# Patient Record
Sex: Female | Born: 1980 | Race: White | Hispanic: No | Smoking: Former smoker
Health system: Southern US, Community
[De-identification: ages and names within clinical notes are randomized; demographics above are authoritative.]

## PROBLEM LIST (undated history)

## (undated) DIAGNOSIS — R87629 Unspecified abnormal cytological findings in specimens from vagina: Secondary | ICD-10-CM

## (undated) DIAGNOSIS — R87810 Cervical high risk human papillomavirus (HPV) DNA test positive: Secondary | ICD-10-CM

## (undated) DIAGNOSIS — F17200 Nicotine dependence, unspecified, uncomplicated: Secondary | ICD-10-CM

## (undated) DIAGNOSIS — B009 Herpesviral infection, unspecified: Secondary | ICD-10-CM

## (undated) DIAGNOSIS — E0590 Thyrotoxicosis, unspecified without thyrotoxic crisis or storm: Secondary | ICD-10-CM

## (undated) DIAGNOSIS — IMO0002 Reserved for concepts with insufficient information to code with codable children: Secondary | ICD-10-CM

## (undated) DIAGNOSIS — U071 COVID-19: Secondary | ICD-10-CM

## (undated) DIAGNOSIS — R87619 Unspecified abnormal cytological findings in specimens from cervix uteri: Secondary | ICD-10-CM

## (undated) DIAGNOSIS — K589 Irritable bowel syndrome without diarrhea: Secondary | ICD-10-CM

## (undated) HISTORY — DX: COVID-19: U07.1

## (undated) HISTORY — DX: Nicotine dependence, unspecified, uncomplicated: F17.200

## (undated) HISTORY — DX: Unspecified abnormal cytological findings in specimens from cervix uteri: R87.619

## (undated) HISTORY — DX: Unspecified abnormal cytological findings in specimens from vagina: R87.629

## (undated) HISTORY — DX: Thyrotoxicosis, unspecified without thyrotoxic crisis or storm: E05.90

## (undated) HISTORY — DX: Cervical high risk human papillomavirus (HPV) DNA test positive: R87.810

## (undated) HISTORY — DX: Irritable bowel syndrome, unspecified: K58.9

## (undated) HISTORY — DX: Reserved for concepts with insufficient information to code with codable children: IMO0002

## (undated) HISTORY — DX: Herpesviral infection, unspecified: B00.9

## (undated) HISTORY — PX: WISDOM TOOTH EXTRACTION: SHX21

---

## 2001-05-05 ENCOUNTER — Emergency Department (HOSPITAL_COMMUNITY): Admission: EM | Admit: 2001-05-05 | Discharge: 2001-05-05 | Payer: Self-pay | Admitting: *Deleted

## 2002-01-07 ENCOUNTER — Encounter: Admission: RE | Admit: 2002-01-07 | Discharge: 2002-01-09 | Payer: Self-pay | Admitting: Orthopedic Surgery

## 2002-03-11 ENCOUNTER — Ambulatory Visit (HOSPITAL_COMMUNITY): Admission: RE | Admit: 2002-03-11 | Discharge: 2002-03-11 | Payer: Self-pay | Admitting: *Deleted

## 2002-03-11 ENCOUNTER — Encounter: Payer: Self-pay | Admitting: *Deleted

## 2002-07-30 ENCOUNTER — Ambulatory Visit (HOSPITAL_COMMUNITY): Admission: AD | Admit: 2002-07-30 | Discharge: 2002-07-30 | Payer: Self-pay | Admitting: *Deleted

## 2002-07-31 ENCOUNTER — Inpatient Hospital Stay (HOSPITAL_COMMUNITY): Admission: AD | Admit: 2002-07-31 | Discharge: 2002-08-03 | Payer: Self-pay | Admitting: *Deleted

## 2002-09-19 HISTORY — PX: RECTOVAGINAL FISTULA CLOSURE: SUR265

## 2003-02-14 ENCOUNTER — Observation Stay (HOSPITAL_COMMUNITY): Admission: RE | Admit: 2003-02-14 | Discharge: 2003-02-15 | Payer: Self-pay | Admitting: *Deleted

## 2004-02-09 ENCOUNTER — Other Ambulatory Visit: Admission: RE | Admit: 2004-02-09 | Discharge: 2004-02-09 | Payer: Self-pay | Admitting: *Deleted

## 2005-01-17 ENCOUNTER — Other Ambulatory Visit: Admission: RE | Admit: 2005-01-17 | Discharge: 2005-01-17 | Payer: Self-pay | Admitting: *Deleted

## 2005-08-22 ENCOUNTER — Ambulatory Visit (HOSPITAL_COMMUNITY): Admission: RE | Admit: 2005-08-22 | Discharge: 2005-08-22 | Payer: Self-pay | Admitting: *Deleted

## 2005-11-04 ENCOUNTER — Ambulatory Visit (HOSPITAL_COMMUNITY): Admission: RE | Admit: 2005-11-04 | Discharge: 2005-11-04 | Payer: Self-pay | Admitting: *Deleted

## 2005-11-04 IMAGING — US US OB FOLLOW-UP
1 series · 13 of 28 positions shown · non-contrast
Comparison: [DATE]

CLINICAL DATA: Recheck spine.

 OBSTETRICAL ULTRASOUND RE-EVALUATION:

[Series 1: unknown · 0.32mm/px · 13 of 44 slices shown]
[im 2/44]
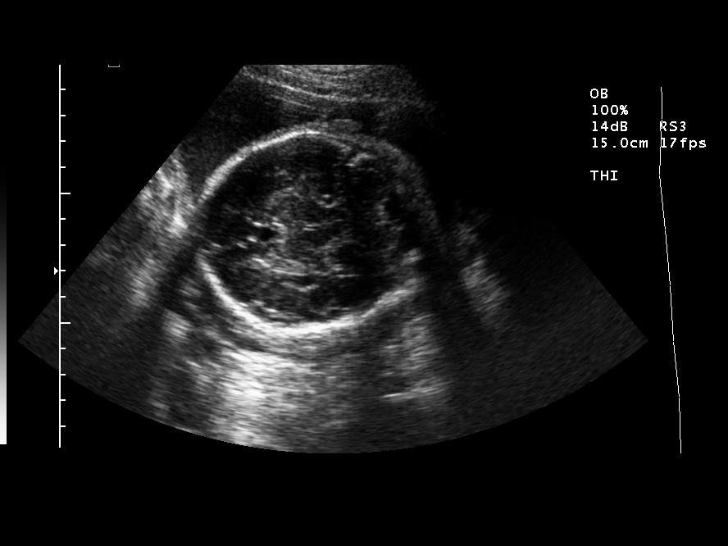
[im 5/44]
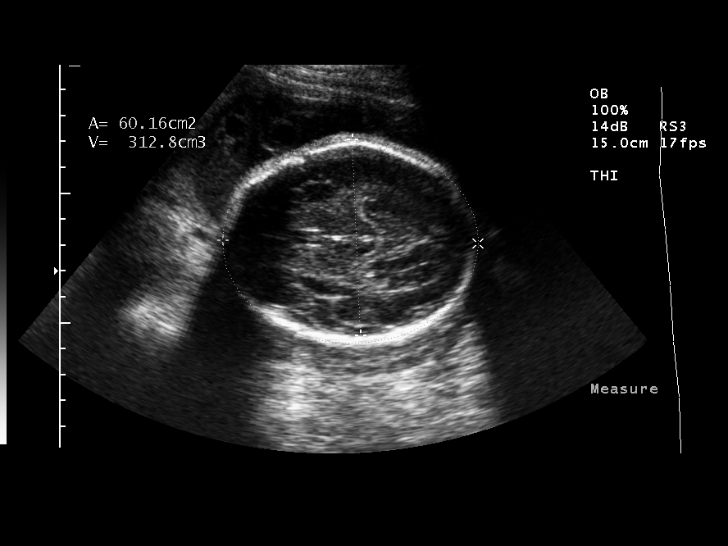
[im 8/44]
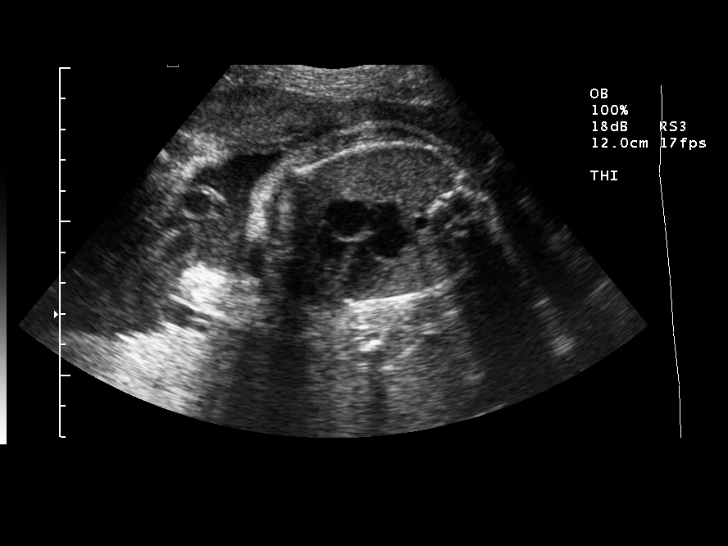
[im 12/44]
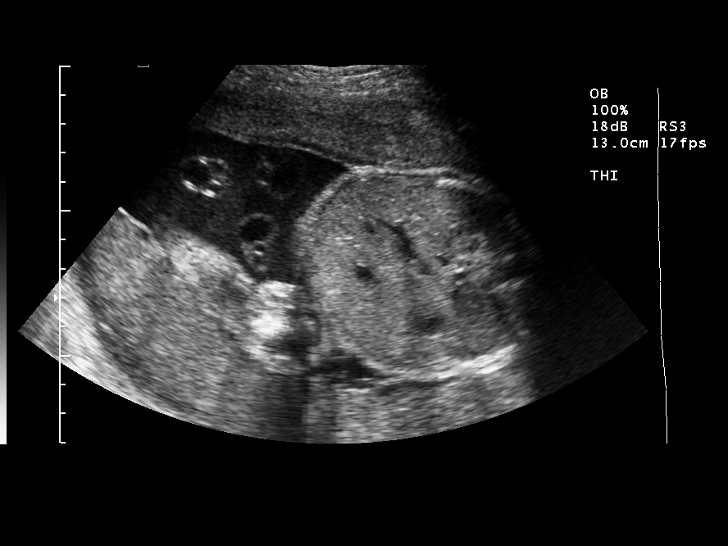
[im 15/44]
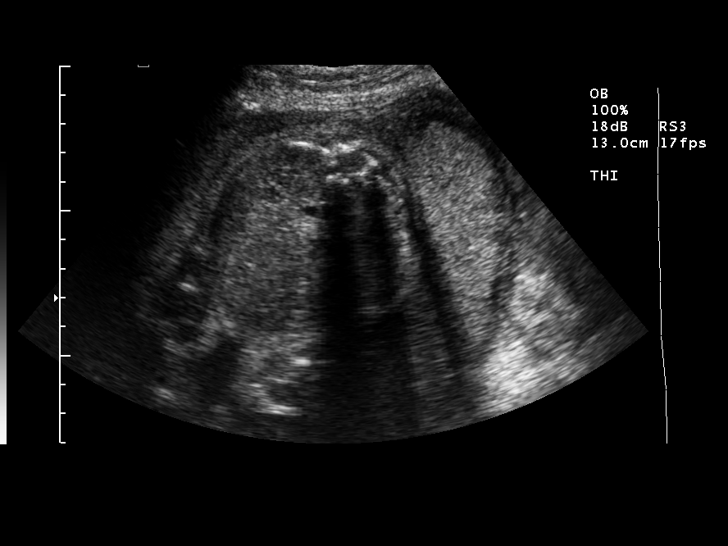
[im 18/44]
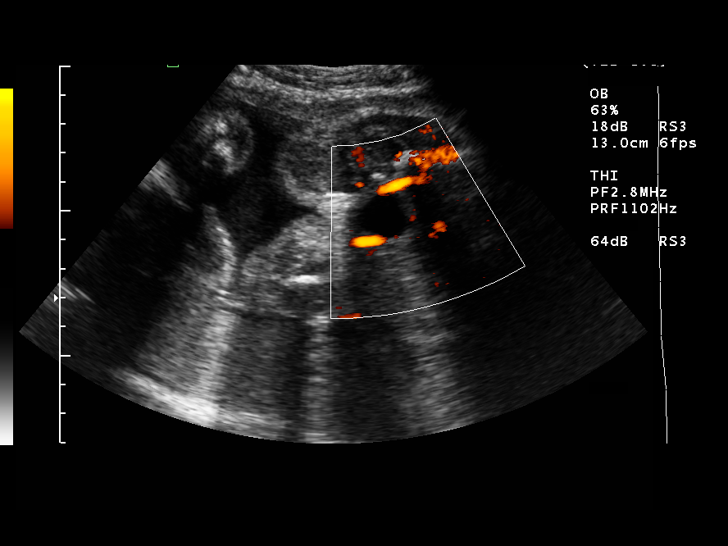
[im 23/44]
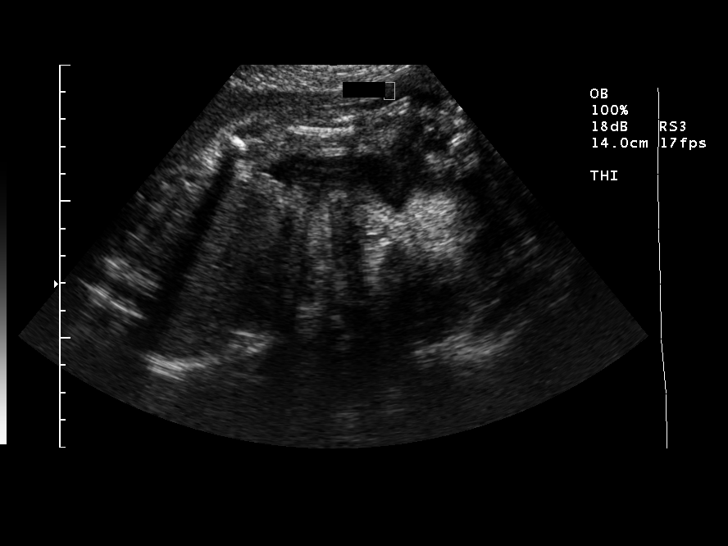
[im 26/44]
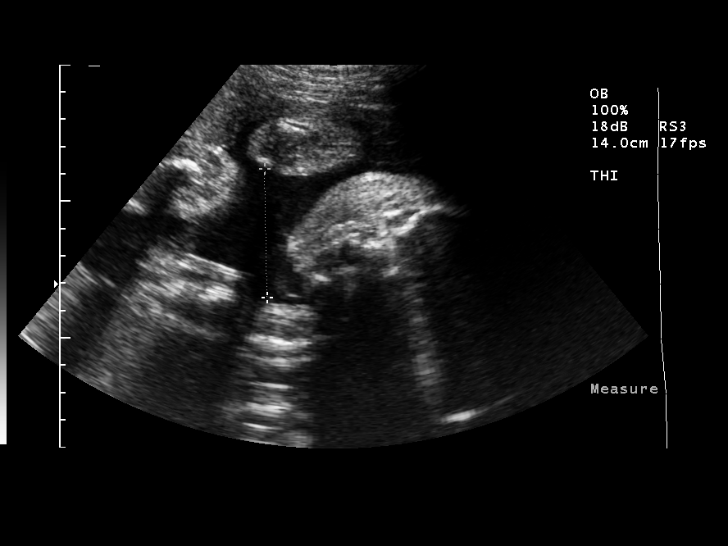
[im 29/44]
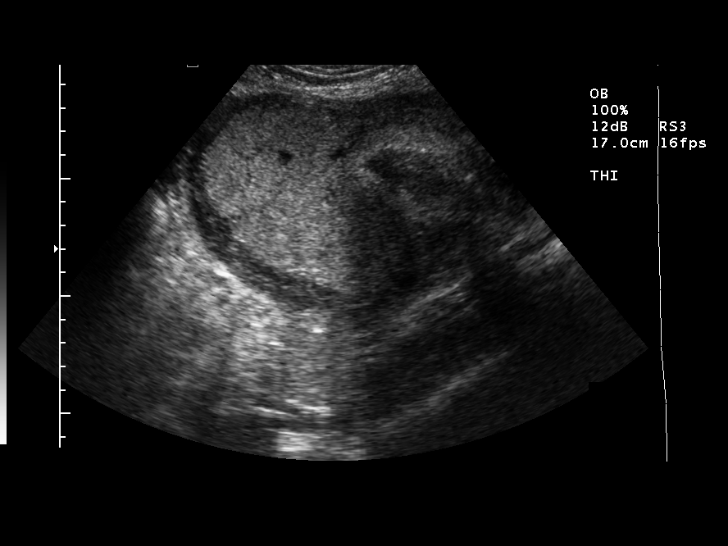
[im 32/44]
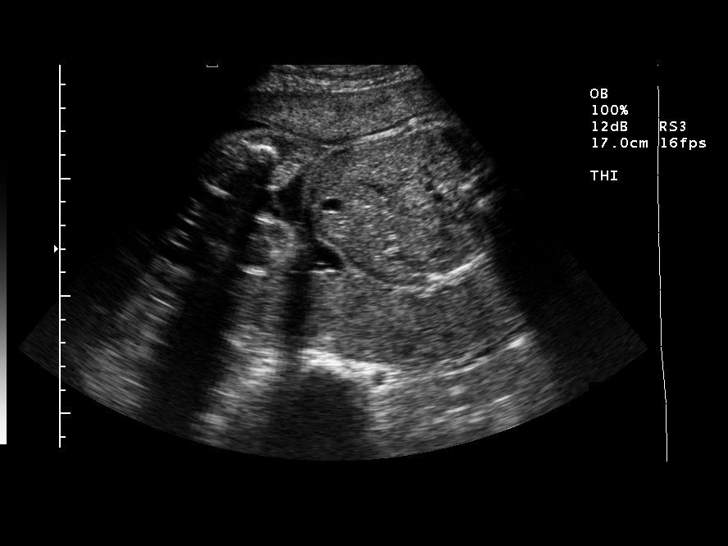
[im 36/44]
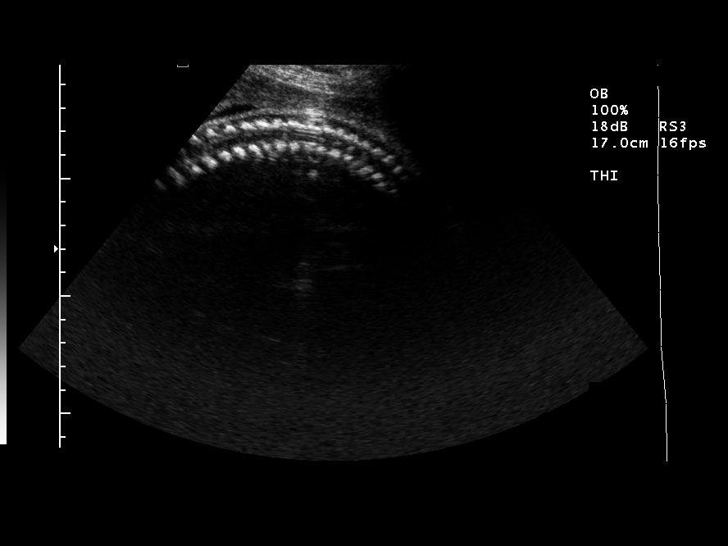
[im 39/44]
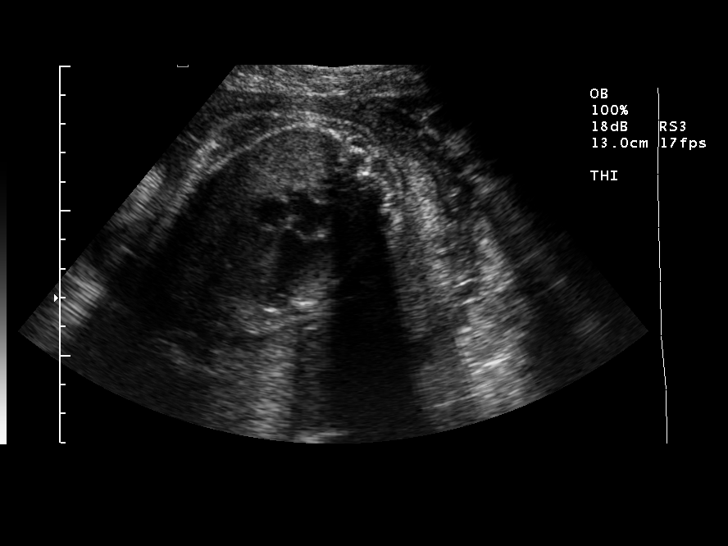
[im 42/44]
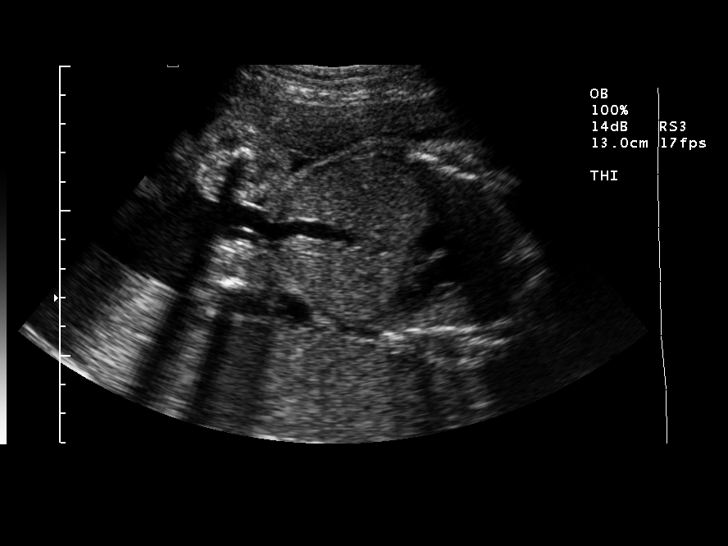

[13 of 28 positions shown; findings below may reference images not displayed]

Number of Fetuses:  1
 Heart Rate:  131 BPM
 Movement:  Yes
 Breathing:  No
 Presentation:  Cephalic
 Placental Location:  Posterior
 Grade:  I
 Previa:  No
 Amniotic Fluid (subjective):  Normal
 Amniotic Fluid (objective):  AFI 11.9 cm ([23] %ile = 9.2 to 23.1 cm for 29  weeks) 

 FETAL BIOMETRY
 BPD:  7.5 cm  30 W 1 D
 HC:    27.7 cm  30 W 3 D
 AC:  24.6 cm  28 W 6 D
 FL:  5.6 cm  29 W 4 D

 Mean GA:  29 W 5 D  US EDC:  [DATE]
 Assigned GA:  28 W 6 D  Assigned GA EDC:  [DATE]

 EFW:  [23]   grams (H) 75th - 90th %ile ([23] – [23] g) for 29 weeks 

 FETAL ANATOMY
 Lateral Ventricles:    Visualized 
 Thalami/CSP:    Visualized 
 Posterior Fossa:    Visualized     
 Nuchal Region:    Previously seen 
 Spine:    Visualized 
 4 Chamber Heart on Left:  Visualized 
 Stomach on Left:  Visualized 
 3 Vessel Cord:  Visualized 
 Cord Insertion Site:  Visualized 
 Kidneys:  Visualized 
 Bladder:  Visualized 
 Extremities:  Previously seen 

 MATERNAL UTERINE AND ADNEXAL FINDINGS
 Cervix:  3.3 cm transabdominally.
IMPRESSION: 1.  Single live intrauterine gestation now in cephalic presentation, breech on previous study.
 2.  No morphologic abnormalities identified, with spine now better visualized and normal in appearance.
 3.  Appropriate interval growth since previous study.
 4.  No acute abnormalities.

## 2006-01-07 ENCOUNTER — Inpatient Hospital Stay (HOSPITAL_COMMUNITY): Admission: AD | Admit: 2006-01-07 | Discharge: 2006-01-10 | Payer: Self-pay | Admitting: *Deleted

## 2007-05-23 ENCOUNTER — Other Ambulatory Visit: Admission: RE | Admit: 2007-05-23 | Discharge: 2007-05-23 | Payer: Self-pay | Admitting: Obstetrics and Gynecology

## 2008-06-27 ENCOUNTER — Other Ambulatory Visit: Admission: RE | Admit: 2008-06-27 | Discharge: 2008-06-27 | Payer: Self-pay | Admitting: Obstetrics and Gynecology

## 2008-09-19 HISTORY — PX: CHOLECYSTECTOMY: SHX55

## 2008-12-22 ENCOUNTER — Encounter (HOSPITAL_COMMUNITY): Admission: RE | Admit: 2008-12-22 | Discharge: 2009-01-21 | Payer: Self-pay | Admitting: Endocrinology

## 2009-01-25 ENCOUNTER — Emergency Department (HOSPITAL_COMMUNITY): Admission: EM | Admit: 2009-01-25 | Discharge: 2009-01-25 | Payer: Self-pay | Admitting: Emergency Medicine

## 2009-12-28 DIAGNOSIS — E0590 Thyrotoxicosis, unspecified without thyrotoxic crisis or storm: Secondary | ICD-10-CM

## 2009-12-28 HISTORY — DX: Thyrotoxicosis, unspecified without thyrotoxic crisis or storm: E05.90

## 2010-03-09 ENCOUNTER — Encounter (HOSPITAL_COMMUNITY)
Admission: RE | Admit: 2010-03-09 | Discharge: 2010-03-10 | Payer: Self-pay | Source: Home / Self Care | Admitting: Endocrinology

## 2010-05-28 ENCOUNTER — Other Ambulatory Visit: Admission: RE | Admit: 2010-05-28 | Discharge: 2010-05-28 | Payer: Self-pay | Admitting: Obstetrics and Gynecology

## 2010-10-10 ENCOUNTER — Encounter: Payer: Self-pay | Admitting: Endocrinology

## 2010-12-28 LAB — URINALYSIS, ROUTINE W REFLEX MICROSCOPIC
Glucose, UA: NEGATIVE mg/dL
Protein, ur: 30 mg/dL — AB
pH: 6.5 (ref 5.0–8.0)

## 2010-12-28 LAB — URINE MICROSCOPIC-ADD ON

## 2011-02-04 NOTE — Op Note (Signed)
Nicole Bell, Nicole Bell                 ACCOUNT NO.:  [id number]   MEDICAL RECORD NO.:  [id number]          PATIENT TYPE:  INP   LOCATION:  A401                          FACILITY:  [facility]   PHYSICIAN:  Langley Gauss, MD     DATE OF BIRTH:  [birth year]   DATE OF PROCEDURE:  DATE OF DISCHARGE:                                 OPERATIVE REPORT   DATE OF DELIVERY:  January 08, 2006.   DELIVERY PERFORMED:  1.  Spontaneous assisted vaginal delivery to a 7 pound, 11 ounce female      infant.  2.  Repair of first-degree perineal laceration.  3.  Placement of continuous lumbar epidural anesthesia.   DELIVERY PERFORMED:  Dr. Langley Gauss.   ESTIMATED BLOOD LOSS:  Less than 500 mL.   COMPLICATIONS:  None.   SPECIMENS:  Arterial cord gas and cord blood to the laboratory. The placenta  is examined and noted to be apparently intact with a three-vessel umbilical  cord. Total estimated blood loss of 500 cc.   SUMMARY:  The patient was admitted for induction of labor.  Initial  examination:  3 cm dilated, 80% effaced, 0 station, vertex is well applied  to the cervix. A fetal scalp electrode was placed with resultant amniotomy  at that time with findings of clear amniotic fluid. The patient had a  reassuring fetal heart rate. She initially requested only IV narcotics for  pain relief, but with the onset of active labor and inadequate response to  the narcotics, she requested an epidural.  An epidural was thus placed in  the p.m. of January 07, 2006, for relief of labor pain.  Initially, the  epidural, in the beginning, was a suboptimal block.  The patient had been  subsequently examined by the nursing staff and noted to be 9 cm dilatation.  At that time, she was given a bolus of 5 cc of 2% lidocaine plain, which  subsequently did result in an excellent bilateral block, which provided  adequate relief for the remainder of the labor course. Subsequently, the  patient was continued on the Pitocin.  She  progressed to  complete  dilatation, at which time she began having the urge to push. The patient  pushed well during the short second stage of labor.  She was placed in the  dorsolithotomy position, and prepped and draped in the usual sterile manner.  The perineum was milked for thinning of the perineal skin during second  stage.  A total of 20 cc of 1% lidocaine plain was injected.  The infant was  noted to descend very well in the birth canal.  The head was well flexed and  noted to be in an __________ position.  Excellent perineal support was  provided at the time of delivery. No episiotomy was required.  The infant  was delivered in a direct away position.  Mouth and nares were bulb  suctioned of clear amniotic fluid. Renewed expulsive efforts resulted in  spontaneous rotation to a left anterior shoulder position. Gentle downward  traction and combined expulsive efforts resulted in very  easy delivery of  this anterior shoulder beneath the pubic symphysis, without difficulty. The  remainder of the infant was also delivered without difficulty.  The  umbilical cord was then __________ towards the infant.  The cord was doubly  clamped and cut and the infant was taken to the nursery table for immediate  assessment. Arterial cord gas and cord blood was then obtained.  Gentle  traction of the umbilical cord resulted in separation, which upon  examination, appeared to be an intact placenta with associated three-vessel  umbilical cord.  Excellent uterine tone was achieved immediately following  delivery. Examination of the genital tract reveals only a first-degree  perineal laceration.  It was easily repaired, utilizing two figure-of-eight  sutures of 0 chromic to restore the normal anatomy. There is no evidence of  any lacerations beyond this.  The patient subsequently was taken out of the  dorsolithotomy position and rolled to her side, at which time the epidural  catheter was removed with  the blue tip noted to be intact. The patient was  advised not to ambulate x2 hours' time. Both mother and infant are doing  well following delivery.      Langley Gauss, MD  Electronically Signed     DC/MEDQ  D:  01/08/2006  T:  01/09/2006  Job:  [id number]

## 2011-02-04 NOTE — Op Note (Signed)
NAME:  Nicole Bell, Nicole Bell                           ACCOUNT NO.:  [id number]   MEDICAL RECORD NO.:  [id number]                   PATIENT TYPE:  INP   LOCATION:  A425                                 FACILITY:  [facility]   PHYSICIAN:  Langley Gauss, M.D.                DATE OF BIRTH:  14-Jan-1981   DATE OF PROCEDURE:  07/31/2002  DATE OF DISCHARGE:  08/03/2002                                 OPERATIVE REPORT   DATE OF DELIVERY:  July 31, 2002.   DIAGNOSES:  1. A 38 plus weeks' intrauterine pregnancy for induction of labor.  2. Gestational hypertension.  No history of hypertension preceding the     pregnancy.   PROCEDURES:  1. Placement of a Foley bulb catheter for mechanical ripening of the cervix.     Epidural analgesia.  The delivery form is a low vacuum extraction 7 pound     4 ounce female infant delivered by Langley Gauss, M.D.  2. Repair of a midline episiotomy with a fourth-degree perineal extension.     Analgesia is continuous lumbar epidural analgesia.  This is supplemented     with a total of 50 cc of 1% lidocaine plain. The findings at time of     delivery include the infant 7 pound 4 ounce female, found to be in an OP     position.   SUMMARY:  July 31, 2002, the patient admitted for induction of labor.  She was noted to have mildly elevated blood pressures during the last week  of her pregnancy.  Due to the unfavorable nature of the cervix, a Foley bulb  catheter was placed on July 31, 2002, for mechanical ripening of the  cervix.  After removal of this, the patient was noted to be 2 cm dilated.  At that time, fetal scalp electrode is placed with resultant amniotomy; a  very strong heart rate is noted.  The patient thereafter was induced with  Pitocin to achieve function of her pattern.  The patient initially was  treated with IV narcotics for pain relief; however, due to inadequate  analgesia provided, this patient requested epidural analgesic.  The epidural  functioned well throughout the remainder of the labor course.  The patient  thereafter reached complete dilatation.  At complete dilatation, the patient  was placed in the dorsal lithotomy position and encouraged to push.  During  this second stage of labor, however, there were prolonged decelerations of  the fetal heart rate to 60-90 beats per minute.  Thus with the vertex at a  +2 station, the decision was made to shorten second stage of labor and  assist with delivery.  Kiwi vacuum extractor was initially utilized.  At all  times pressure was kept within the safety range.  On the successive next  three contractions, gentle traction was provided with the Kiwi vacuum  extractor while the patient was encouraged to maximize  her expulsive  efforts.  There was descent of the vertex to the perineal floor at which  time the Kiwi vacuum extractor was noted to pop off.  There was noted to be  beginning of molding of the fetal vertex, thus the Mityvac vacuum was  placed.  This was then connected to the hand-held pump.  At all times,  pressure was kept within the safe_______ range.  On the successive next two  contractions with gentle traction and expulsive efforts, the infant was  noted to deliver in the OP position.  With distension of the perineum, a  midline episiotomy had been performed.  Despite excellent perineal support  provided at time of delivery, the midline episiotomy was noted to extend to  a fourth-degree perineal laceration.  Following delivery of the vertex, the  mouth and nares were bulb-suctioned of clear amniotic fluid.  We noted  expulsive efforts resulted in a spontaneous rotation to a left anterior  shoulder position.  Gentle down-retraction combined with expulsive efforts  resulted in delivery the shoulder, on Kiwi assist, symphysis as well as  remainder of the infant.  The umbilical cord is then milked towards the  infant and infant is placed on the warming table for  immediate assessment.  Arterial cord gas and cord blood are then obtained.  Gentle traction of the  umbilical cord results in separation which upon examination appears to be an  intact three vessel placenta and cord.  The perineal area is then examined.  The midline episiotomy is noted to extend to a fourth-degree perineal  laceration.  The rectal mucosa being involved the length of about 3 cm.  No  stool or any particulate matter is identified.  The area is sterilely  prepped utilizing the Calgon solution.  The edges of the rectal mucosa are  identified.  The rectal mucosa is closed with a continuous running 3-0  Monocryl suture in a through-and-through fashion.  Vicryl sutures of 2-0  then utilized to reinforce the pararectal fascia overlying this suture  length.  The rectal sphincter is then identified.  The edges are grasped  with Allis clamps.  The rectal sphincter is reapproximated in the midline  utilizing interrupted 2-0 Vicryl sutures placed anterior to posterior,  superiorly and inferiorly to restore the normal anatomy.  Following this,  some additional 2-0 Vicryl sutures are placed to the superficial transverse  perineal muscle for additional support.  The vaginal mucosa is then closed  with a continuous running 0 chromic suture in a running lock fashion  followed by a layer of 0 chromic suture on the perineal body.  Following  delivery, the vagina appears to be intact.  However, rectal examination is  performed which reveals a buttonhole defect at the most superior aspect of  the rectal repair.  The defect, about 0.5 cm in size with complete  nonapproximation of the rectal mucosa.  At this point in time, in an effort  to promote healing and prevent any postoperative fistula formation, the  entire procedure is reperformed.  In the midline, the suture scissors are  used to cut through each of our suture layers to result in again the opened midline episiotomy with a fourth-degree  perineal extension.  The tissue is  again carefully examined and identified, again sterile prepped utilizing the  Calgon solution.  Re-repair is then required.  A Gelpi retractor is called  for which does assist with visualization.  This then allows me to better  visualize the rectal  mucosa which is again then reapproximated utilizing a 3-  0 Monocryl suture in a through-and-through fashion.  Again 2-0 Vicryl suture  is placed to reinforce the perirectal fascia.  The rectal sphincter is  likewise reapproximated in the midline utilizing interrupted 2-0 Vicryl  suture.  Superficial transverse perineal muscle reinforced with 2-0 Vicryl  suture and the vaginal mucosa closed with a 0 chromic in a running lock  fashion followed by 0 chromic in the perineal body.  The patient tolerated  the delivery very well.  She did require reinjection of 20 cc of 1%  lidocaine plain, thus bringing the total to 50 cc of 1% lidocaine plain  utilized for the repair.  In addition, the patient was treated with 10 mg of  IV Nubain during the repair.  Examination at this time following repair  reveals restoration of the vaginal mucosa.  Rectal examination is performed.  The rectal sphincter is intact.  Likewise, the entirety of the rectal mucosa  is intact with no evidence of any anatomic defect.  Thus the procedure is  terminated.  The patient is cleansed.  She is taken out of dorsal lithotomy  position and rolled to her side at which time the epidural catheter is  removed with the blue tip noted to be intact.  Both mother and infant doing  well following delivery.  The patient is informed of the nature of the  episiotomy and subsequent repair.  She will be treated during the postpartum  period with stool softeners, Colace 100 mg p.o. q.i.d.  In addition, 3 g of  IV Unasyn was administered during the repair.                                               Langley Gauss, M.D.    DC/MEDQ  D:  08/05/2002  T:   08/05/2002  Job:  [id number]

## 2011-02-04 NOTE — Op Note (Signed)
NAMEDORINA, RIBAUDO NO.:  [id number]   MEDICAL RECORD NO.:  [id number]                  PATIENT TYPE:   LOCATION:                                       FACILITY:   PHYSICIAN:  Langley Gauss, M.D.                DATE OF BIRTH:   DATE OF PROCEDURE:  02/14/2003  DATE OF DISCHARGE:                                 OPERATIVE REPORT   PREOPERATIVE DIAGNOSIS:  Rectovaginal fistula.   POSTOPERATIVE DIAGNOSIS:  Rectovaginal fistula.   PROCEDURE:  Repair of rectovaginal fistula and reapproximation of internal  and external rectal sphincter.   SURGEON:  Roylene Reason. Lisette Grinder, M.D.   ESTIMATED BLOOD LOSS:  Less than 100 cc.   ANALGESIA:  General endotracheal in addition 30 cc of 0.5% bupivacaine plain  is injected along the suture line at the completion of the procedure.   FINDINGS AT THE TIME OF SURGERY:  A rectovaginal fistula about the diameter  of the tip of my index fingers which was 2 cm up from the hymenal ring.  In  addition the tone at the rectal sphincter was known to be attenuated.   SUMMARY:  The patient was taken to the operating room, vital signs were  stable.  The patient underwent uncomplicated induction of general  endotracheal anesthesia at which time she was placed in the lithotomy  position utilizing the candy cane stirrups.  Examination at this time  revealed a large amount of semi-solid, brown, nonmalodorous stool present  within the rectum.  This was present despite the fact that the patient had  been advised to take over-the-counter laxatives x1 week duration.  Thus a  mechanical bowel prep is performed at this time.  I digitally manipulated  the stool vaginal and milked it out the rectum.  Betadine prep was then  performed.  After some period of time additional manipulation vaginal  resulted in additional stool being obtained.  This was continued as high as  I could reach until no additional stool was obtained.   The vaginal as  well as the rectal area are then prepped with Betadine  solution, no stool is encountered.  A vaginal packing is then placed within  the rectum above our area of repair to prevent any stooling during the  operative procedure.  Examination revealed the persistent rectovaginal  fistula as described previously.  To repair this I placed Allis clamp at the  posterior vaginal vestibule.  With these on traction a sharp knife was used  to incise between these two.  This then allowed me to dissect in the  avascular plane between the vaginal mucosa and the underlying rectovaginal  fascial layer.  This dissection was continued up to the area of the  rectovaginal fistula and slightly beyond.  This allows easy visualization of  the rectal mucosa which then allows the edges to be easily reapproximated  with a continuous  running 2-0 chromic suture being careful to begin the  suture line above the apex of the defect noted.  The continuous running  suture is continued until the rectal mucosa is entirely reapproximated.   The rectal sphincter itself is noted to be also deficient in tone, thus  dissection is performed at 12 o'clock at the level of the internal and  external rectal sphincter dissecting on each side down to the level where  the rectal sphincter muscles are encountered.  These dissected free are then  grasped with Allis clamps and reapproximated in the midline utilizing 2-0  Vicryl sutures placing these anterior, posterior, superior and inferiorly to  restore the normal muscular anatomy.  These are then brought together in the  midline which does increase the muscular tension of the anal opening.  I  then continued with re-enforcement overlying the rectal mucosa.  The  perirectal fascia is identified and this is oversewn in the midline  overlying our rectal mucosa repair utilizing 2-0 Vicryl suture in an  interrupted fashion.  This completely covers our suture line and is  continued to the  outermost portion of the rectal mucosa.  In addition, the  levator ani muscle as well as the superficial transverse perineal muscle is  reapproximated in the midline utilizing a 2-0 Vicryl interrupted suture.   A third suture line is then placed utilizing #0 chromic in a running lock  fashion to reapproximate the vaginal mucosa.  This is begun at the apex of  the incision prior to this, redundant vaginal mucosa is sharply excised.  A  transition suture is placed at the level of the hymenal ring.  The skin the  perineal body is then completely reapproximated utilizing this continuous  running #0 chromic suture to restore the normal anatomy.  Following the  procedure, a rectal examination is performed which reveals an intact  rectovaginal septum.  No areas of weakness or defect are identified. The  packing within the rectum is then removed.  No stool is identified.  An  additional clamping of the vagina followed by the rectal area is then  performed utilizing Betadine.   The patient tolerated the procedure well.  To facilitate postoperative  analgesia a total of 30 cc of 0.5% bupivacaine plain is injected along our  suture line.  The patient is then reversed of anesthesia and taken to the  recovery room in stable condition at which time the operative findings were  discussed with the patient's awaiting family.  The patient, at this point in  time, does have a Foley catheter placed to straight drainage, draining clear  yellow urine.                                               Langley Gauss, M.D.    DC/MEDQ  D:  02/14/2003  T:  02/15/2003  Job:  [id number]

## 2011-02-04 NOTE — Op Note (Signed)
   NAME:  Nicole Bell, Nicole Bell                           ACCOUNT NO.:  [id number]   MEDICAL RECORD NO.:  [id number]                   PATIENT TYPE:  INP   LOCATION:  A425                                 FACILITY:  [facility]   PHYSICIAN:  Langley Gauss, M.D.                DATE OF BIRTH:  1980-11-01   DATE OF PROCEDURE:  07/31/2002  DATE OF DISCHARGE:  08/03/2002                                 OPERATIVE REPORT   PROCEDURE:  Placement of Foley bulb for mechanical ripening of cervix prior  to amniotomy, procedure performed by Langley Gauss, M.D.   DESCRIPTION OF PROCEDURE:  Appropriate informed consent was obtained.  External fetal monitoring had revealed a reassuring fetal heart rate with a  reactive nonstress test and no fetal heart rate decelerations noted.  There  were noted to be some irregular uterine contractions, very mild in  intensity.  The patient is placed in the dorsal lithotomy position.  The  cervix is visualized and noted to be about 1 cm dilated by previous  examination.  The 16 French Foley catheter with the soft tip is then  inserted through the endocervical os into the lower uterine segment and  inflated to a volume of 50 cc of sterile normal saline.  Gentle traction  confirms proper placement.  The procedure is then terminated.  Fetal heart  rate is again noted to be reassuring with a continued reactive nonstress  test following the procedure.  The patient is returned to the left lateral  supine position.                                               Langley Gauss, M.D.    DC/MEDQ  D:  08/05/2002  T:  08/05/2002  Job:  [id number]

## 2011-02-04 NOTE — Op Note (Signed)
   NAME:  Nicole Bell, Nicole Bell                           ACCOUNT NO.:  [id number]   MEDICAL RECORD NO.:  [id number]                   PATIENT TYPE:  INP   LOCATION:  A425                                 FACILITY:  [facility]   PHYSICIAN:  Langley Gauss, M.D.                DATE OF BIRTH:  03/22/1981   DATE OF PROCEDURE:  07/31/2002  DATE OF DISCHARGE:  08/03/2002                                 OPERATIVE REPORT   PROCEDURE:  Placement of continuous lumbar epidural analgesia at the L3-4  interspace, performed by Langley Gauss, M.D.   COMPLICATIONS:  None.   DESCRIPTION OF PROCEDURE:  Appropriate informed consent was obtained.  The  patient was placed in a seated position.  The bony landmarks were  identified.  The L3-4 interspace was chosen.  The patient's back is  sterilely prepped and draped in the usual manner.  Five cubic centimeters of  1% lidocaine plain is injected to raise a small skin wheal.  A 17-gauge  Tuohy-Schliff needle is then utilized with loss of resistance to an air-  filled glass syringe to identify entry into the epidural space on the first  attempt without difficulty.  An initial test dose of 5 cc 1.5% lidocaine  plus epinephrine injected through the epidural needle.  No signs of CSF or  intravascular injection obtained.  Thus the epidural catheter was inserted  to a depth of 5 cm.  The epidural needle is removed.  Aspiration test is  negative.  A second dose of 2 cc 1.5% lidocaine plus epinephrine injected  through the epidural catheter.  Again no signs of CSF or intravascular  injection obtained.  Thus the patient was connected to an infusion pump  containing the standard mixture.  She will be treated with a bolus of 10 cc,  followed by a continuous infusion rate of 14 cc/hr.  Upon completion of the  procedure, the patient is returned to the left lateral supine position, had  evidence of a well-setting up bilateral epidural block.           Langley Gauss, M.D.    DC/MEDQ  D:  08/05/2002  T:  08/05/2002  Job:  [id number]

## 2011-02-04 NOTE — Discharge Summary (Signed)
NAME:  Nicole Bell, HAGGARD                           ACCOUNT NO.:  [id number]   MEDICAL RECORD NO.:  [id number]                   PATIENT TYPE:  INP   LOCATION:  A425                                 FACILITY:  [facility]   PHYSICIAN:  Langley Gauss, M.D.                DATE OF BIRTH:  08-12-1981   DATE OF ADMISSION:  07/31/2002  DATE OF DISCHARGE:  08/03/2002                                 DISCHARGE SUMMARY   DIAGNOSES:  1. Thirty-eight-plus-week intrauterine pregnancy.  2. Gestational hypertension.   PROCEDURES:  1. Placement of Foley bulb.  2. Placement of epidural.  3. Low vacuum extraction, 7-pound 4-ounce female infant in OP position.  4. Repair of midline episiotomy with a fourth-degree perineal extension.   FOLLOW-UP:  The patient will follow up in the office in three days' time for  examination of the integrity of the pelvic repair.   DISCHARGE MEDICATIONS:  1. Tylox #30 for pain relief.  2. Colace 100 mg p.o. q.i.d. x 10 days.  3. Peri-Colace 1 p.o. q.d. x 10.   The patient is breast-feeding at the time of discharge.  She is utilizing  pediatrician on call, Dr. Vivia Ewing.   LABORATORY DATA:  A positive blood type.  Admission hemoglobin and  hematocrit 11.5/32.7 with a white count of 8.3.  Postpartum day #1  hemoglobin 9.5, hematocrit 27.2.   HOSPITAL COURSE:  The patient was admitted on July 31, 2002.  Foley bulb  initially placed for mechanical ripening of the cervix.  Upon removal of the  Foley bulb catheter she was noted to be 2 cm dilated.  Amniotomy was  performed with the findings of clear amniotic fluid.  Fetal scalp electrode  was placed.  The patient did receive Pitocin induction at this time with  discomfort associated with uterine contractions.  The patient requested  epidural analgesic.  Epidural functioned very well throughout the remainder  of the labor course.  The patient progressed normally along the labor curve  to complete dilatation.  She did  have the prolonged deceleration during the  second stage of labor as described previously.  To shorten the second stage  of labor, a low vacuum extraction was performed, complicated by findings of  an OP position.  A midline episiotomy and fourth-degree perineal extension  were repaired.  The patient received 3 g of IV Unasyn during repair.  Postpartum the patient did very well.  She did complain of significant  perineal discomfort which responded well to p.o. Tylox.  In addition, the  patient was noted to have significant soft-tissue edema which did improve  during the postpartum period.  The patient was, however, able to ambulate  and void without difficulty.  She had no malodorous discharge.  The patient  did not have a bowel movement during this hospital stay but was treated  aggressively with stool softeners.  The patient did well to  the time of  discharge on August 03, 2002.  At that time the perineal repair was again  examined.  It appears to be intact, with no malodorous discharge.  No stool  present.  Thus, patient discharged home on August 03, 2002, as stated  previously, to follow up for reevaluation of the integrity of the repair.  Discussion is held at the time of discharge with the patient regarding the  midline episiotomy with the extension to a fourth-degree perineal laceration  primarily due to the OP position of the infant at the time of delivery.  The  patient expresses understanding and will take stool softeners as previously  described.                                               Langley Gauss, M.D.    DC/MEDQ  D:  08/05/2002  T:  08/05/2002  Job:  [id number]

## 2011-02-04 NOTE — Discharge Summary (Signed)
NAMESHELBI, Nicole Bell                 ACCOUNT NO.:  [id number]   MEDICAL RECORD NO.:  [id number]          PATIENT TYPE:  INP   LOCATION:  A401                          FACILITY:  [facility]   PHYSICIAN:  Langley Gauss, MD     DATE OF BIRTH:  Feb 08, 1981   DATE OF ADMISSION:  01/07/2006  DATE OF DISCHARGE:  04/24/2007LH                                 DISCHARGE SUMMARY   DATE OF PLACEMENT OF THE EPIDURAL:  January 07, 2006.   DATE OF VAGINAL DELIVERY:  January 08, 2006.   Patient is given a copy of standardized discharge instructions at time of  discharge.  She does desire an IUD for postpartum birth control purposes.  She is advised this can be placed six weeks or more postpartum.   DISCHARGE MEDICATIONS:  Tylox #20 without refill.   PERTINENT LABORATORY STUDIES:  GBS carrier status found to be positive.  Patient was treated during the course of labor with IV ampicillin in an  effort to prevent vertical transmission.  The infant also was observed  greater than x48 hours postpartum for any evidence of early onset group B  sepsis.  Both mother and infant doing well on postpartum day greater than 48  hours and discharged to home.  The patient is aware that she will have to  seek another care [name] for ongoing continued postpartum care.   HOSPITAL COURSE:  Patient admitted a.m. of January 07, 2006.  Separate  dictation is performed.  Onset of active labor.  Patient request epidural.  This was placed in p.m. of January 07, 2006 without difficulty.  This  functioned very well throughout the remainder of labor course.  Patient  progressed well through an uncomplicated vaginal delivery on January 08, 2006.  Postpartum patient did well.  Fully ambulatory.  Had only minimal discomfort  associated with repair of the first-degree perineal laceration.  No evidence  of any breakdown or trauma to the perirectal area.  Thus, patient discharged  home on January 10, 2006.      Langley Gauss, MD  Electronically Signed     DC/MEDQ  D:  01/15/2006  T:  01/16/2006  Job:  [id number]

## 2011-02-04 NOTE — Op Note (Signed)
Nicole Bell, Nicole Bell                 ACCOUNT NO.:  [id number]   MEDICAL RECORD NO.:  [id number]          PATIENT TYPE:  INP   LOCATION:  A401                          FACILITY:  [facility]   PHYSICIAN:  Langley Gauss, MD     DATE OF BIRTH:  [birth year]   DATE OF PROCEDURE:  01/07/2006  DATE OF DISCHARGE:                                 OPERATIVE REPORT   PROCEDURE PERFORMED:  Placement of continuous lumbar epidural analgesia at  the L3-L4 interspace; performed by Dr. Langley Gauss.   COMPLICATIONS:  None   SUMMARY:  Appropriate informed consent was obtained. The patient had  epidural placed with previous labor and delivery.  She again requested  placement of an epidural for relief of labor pain at this time.  She was  placed in a seated position. Bony landmarks were identified. The L3-L4  interspace was selected; 5 cc 1% lidocaine plain injected at the midline in  the L3-L4 interspace to raise a small skin wheal. A 17 gauge Tuohy Schliff  needle was then utilized with loss of resistance and air-filled glass  syringe to atraumatically identify entry into the epidural space on the  first attempt without difficulty. Excellent loss of resistance is noted.  Initial test dose was 5 cc 1.5% lidocaine plus epinephrine injected through  the epidural needle. No signs of CSF or intravascular injection obtained.  Epidural catheter was then inserted to a depth of 5 cm.  The epidural needle  was removed. Aspiration test, however, does reveal the presence of blood.  The catheter was then withdrawn 1 cm; again aspiration revealed blood.  It  then withdrawn an additional 2 cm, which should leave [place] 2 cm within the  epidural space.  On this attempt no blood was obtained with aspiration.  The  second test dose of 2 cc 1.5% lidocaine plus epinephrine injected through  the catheter. No signs of CSF or intravascular injection obtained. The  patient is having warmth in the legs, consistent with setting up  epidural  block. The catheter was secured in place. The patient was connected to the  infusion pump, containing the standard mixture which will be infused at a  rate of 14 cc/hour.      Langley Gauss, MD  Electronically Signed     DC/MEDQ  D:  01/08/2006  T:  01/09/2006  Job:  [id number]

## 2011-02-04 NOTE — H&P (Signed)
Nicole Bell, Nicole Bell                 ACCOUNT NO.:  [id number]   MEDICAL RECORD NO.:  [id number]          PATIENT TYPE:  INP   LOCATION:  A401                          FACILITY:  [facility]   PHYSICIAN:  Langley Gauss, MD     DATE OF BIRTH:  12/28/1980   DATE OF ADMISSION:  01/07/2006  DATE OF DISCHARGE:  LH                                HISTORY & PHYSICAL   HISTORY OF THE PRESENT ILLNESS:  This is a 30 year old gravida 2, para 1 at  62-[redacted] weeks gestation who is admitted for induction of labor.  Apparently  induction had been planned for this date previously, but due to a high  census it had been cancelled enabling [place] to utilize resources at that time,  and induction was rescheduled for January 07, 2006.  The patient's prenatal  course has been complicated GBS __________  status positive.  She will be  treated with antibiotics __________  labor.  She was noted to not have a  normal AFP triplet screen.  Serial ultrasounds have adequate fetal growth.  Apparently on her most recent she did have a limited ultrasound performed,  which showed the baby to be back up consistent with occiput anterior  position of the fetal vertex.   PAST OBSTETRICAL HISTORY:  The patient's past OB history is pertinent for a  prior vaginal delivery utilizing an epidural, but requiring suctioning.  The  infant at that time was noted to be delivered in the occiput posterior  position resulting in a fourth degree laceration, which was repaired.  The  patient subsequently had breakdown of that fourth degree repair, which  became clinically evident about six months postpartum and it was re-repaired  at that time with excellent healing and no residual functional or anatomical  defect.  The patient, however, was offered primary C section or this labor  and delivery to avoid injury to the perineal or rectal area again.  Overall,  the patient prefers a trial of labor; and, with the cervix noted to be very  favorable, the vertex  engaged and in an occiput anterior position the  prognosis for vaginal delivery was considered to be good; and, all efforts  will be taken to protect the integrity of the perineum.   PAST MEDICAL HISTORY:  The patient is also noted to have a history of a LEEP  procedure for severe dysplasia. She has subsequently had normal Pap smears  since that procedure was performed.   ALLERGIES:  The patient has no known drug allergies.   MEDICATIONS:  Current medications include prenatal vitamins.   PHYSICAL EXAMINATION:  GENERAL APPEARANCE:  This is a pleasant white female.  VITAL SIGNS:  Weight 135 pounds, height 5 feet 2 inches, blood pressure is  125/70, pulse rate 80, respiratory rate 20.  HEENT:  The head, eyes, ears, nose and throat are negative.  NECK:  No adenopathy.  The neck is supple.  Thyroid is not palpable.  LUNGS:  The lungs are clear.  HEART:  Cardiovascular exam shows regular rate and rhythm.  ABDOMEN:  The abdomen is soft  and nontender.  No surgical scars are  identified.  She is noted to be vertex presentation by Leopold's maneuver.  Fundal height of 36 cm.  EXTREMITIES:  The extremities are normal.  PELVIC:  The pelvic exam shows normal external genitalia.  Long perineal  body.  Intact rectovaginal septum.  The pelvis is clinically adequate.  Cervix is 3 cm dilated, 80% effaced, 0 station and vertex presentation.  External fetal monitoring reveals a very strong fetal heart rate.  The  patient is having contractions about q.5 minutes, which are very mild to  palpation.   ASSESSMENT AND PLAN:  1.  Thirty-eight to 39-week gestation for induction of labor.  2.  Group B Streptococcus __________  status positive; will be treated      during the course of labor.  3.  History of fourth degree perineal laceration with repair of subsequently      breakdown, now with adequate repair.   The patient will likely request epidural during the course of labor and all  efforts will be  made to protect the integrity of the perineum, most  specifically avoid use of the vacuum unless necessary and avoid the use of  an episiotomy.      Langley Gauss, MD  Electronically Signed     DC/MEDQ  D:  01/08/2006  T:  01/08/2006  Job:  [id number]

## 2011-02-04 NOTE — Discharge Summary (Signed)
NAME:  Nicole Bell, Nicole Bell                           ACCOUNT NO.:  [id number]   MEDICAL RECORD NO.:  [id number]                   PATIENT TYPE:  OBV   LOCATION:  A426                                 FACILITY:  [facility]   PHYSICIAN:  Langley Gauss, M.D.                DATE OF BIRTH:  [birth year]   DATE OF ADMISSION:  02/14/2003  DATE OF DISCHARGE:  02/15/2003                                 DISCHARGE SUMMARY   DIAGNOSIS:  Rectovaginal fistula.   PROCEDURE:  Performed repair of rectovaginal fistula.   SURGEON:  Langley Gauss, M.D.   DISPOSITION:  Patient is to followup in the office in one week's time for re-  evaluation of the perineum and the repair of the rectovaginal area.   DISCHARGE MEDICATIONS:  1. Colace 100 mg p.o. q.i.d. x 10 days.  2. Tylox 1-2 every 4-6 hours p.r.n. postoperative pain, #30 with no refill.  3. Patient is likewise to continue previous prescriptions for Keflex 500 mg     p.o. b.i.d.  4. Flagyl 500 mg p.o. b.i.d.   DISCHARGE INSTRUCTIONS:  The patient is given instructions to take the stool  softeners and avoid constipation.  She in addition can take showers or tub  bath and is advised of the importance of keeping the perineal area clean and  dry.  Patient will be assessing functioning for appropriate healing.   PERTINENT LABORATORY STUDIES:  Hemoglobin, hematocrit 14.2/40.3 with a white  count of 5.7.  HCG is negative.  A positive blood type.   HOSPITAL COURSE:  Taken to the operating room on Feb 14, 2003 utilizing  general anesthesia the repair as previously dictated done without  complications.  Postoperatively the patient did very well.  She was very  apprehensive regarding voiding however, after removal of the Foley catheter  was able to void without difficulty.  She did not have any bowel movements  during this hospitalization but was started on stool softeners.  The patient  notably did notice improved functioning and that she was more able to  control  her flatus.  She did state that she had significant pulling type  pain in the perineal area which is likely due to the repair of the rectal  sphincter.  On evaluation on Feb 15, 2003 she was noted to have minimal  swelling.  No vaginal bleeding identified.  She remained afebrile.  She has  had no drainage from the rectovaginal area.  Tolerating a regular general  diet and tolerating p.o. pain medication for pain relief.  Thus she is  discharged to home on Feb 15, 2003.                                                Langley Gauss, M.D.  DC/MEDQ  D:  02/19/2003  T:  02/19/2003  Job:  [id number]

## 2011-02-04 NOTE — H&P (Signed)
NAME:  Nicole Bell, Nicole Bell                           ACCOUNT NO.:  [id number]   MEDICAL RECORD NO.:  [id number]                   PATIENT TYPE:  OBV   LOCATION:  A426                                 FACILITY:  [facility]   PHYSICIAN:  Langley Gauss, M.D.                DATE OF BIRTH:  Mar 31, 1981   DATE OF ADMISSION:  02/14/2003  DATE OF DISCHARGE:  02/15/2003                                HISTORY & PHYSICAL   HISTORY OF PRESENT ILLNESS:  The patient is a 30 year old gravida 1, para 1  who is status post a low vacuum extraction on July 31, 2002 with a 7  pound 4 ounce female infant, delivered in the OP position.  At the time of  delivery a midline episiotomy was noted to extend into a fourth degree  perineal laceration, which was subsequently repaired appropriately.  The  patient was discharged to home without complications.  The patient  subsequently was seen for follow up in the office on August 06, 2002 for a  check of the perineal repair.  At that time she complained only of pitting  edema in the extremities.  She was bottle feeding.  She was treated with  hydrochlorothiazide.  At that time the patient was afebrile.  The repair on  examination was noted to be intact.  The patient does state that she had had  a bowel movement on August 05, 2002 which was soft and thin like water.  She was not requiring any pain medication at that time and she was taking a  store brand of a stool softener on a t.i.d. basis.  The patient did state  that she had some problems with flatus at that time but she was getting to  where she was able to control it.  She denied any rectal incontinence at  that time.  The patient subsequent was advised to follow up in the office  for reevaluation in one week's time.  However, on August 07, 2002 we  received only a phone call per the nurse Clydie Braun at [facility]  [facility], stating that the patient did not have her HCTZ filled and no  other  complications or concerns at that time.  The patient was subsequently  seen for a post partum evaluation on August 21, 2002 at which time I  specifically addressed her functioning.  Specifically she denied any  incontinence of stool or flatus.  On examination the perineal repair was  noted to be intact.  A rectal vaginal examination was performed which  revealed an intact rectovaginal mucosa.  The patient was started on birth  control pills at that time.  A subsequent visit on October 04, 2002 was for  complaints of pain with intercourse only at which time she was noted to have  a small erosion on the external right labia.  The patient was tried on  Premarin cream at  that time for local treatment only.  However, she never  had this filled secondary to concerns regarding the package insert on the  Premarin.  Most currently at that time on the visit of October 04, 2002 the  patient had no complaints of stool or flatus incontinence nor did she have  any drainage from the vaginal area.  Subsequently, I did not have any  contact with the patient until the p.m. of Feb 01, 2003.  At that time the  patient states that the p.m. prior she had eaten greasy food and had some  diarrhea, at which time she noted some loose stool apparently coming from  the vaginal area as well as from the rectal area and she felt that this was  incontinence of stool.  The patient, prior to this, had absolutely no  complaints of any abnormal drainage or leakage from the vaginal area.  She  did, however, state that for several weeks duration she felt as though she  may have been passing some gas vaginally but there was no complaints of  incontinence or anything to suggest development of a rectovaginal fistula.  On a phone conversation on Feb 01, 2003 the patient was advised to be seen  in the office on Feb 04, 2003 for evaluation.   CURRENT MEDICATIONS:  Ortho Tri-Cyclen low.   HABITS:  She does smoke about five  cigarettes per day.   ALLERGIES:  No known drug allergies.   PAST MEDICAL HISTORY:  1. She is noted to have one prior spontaneous AB.  2. The previously described vacuum extraction with the fourth degree     extension.   REVIEW OF SYSTEMS:  No preceding history of irritable bowel syndrome.  No  chronic diarrhea or chronic constipation, nor does she have any previous GI  ailments.   SOCIAL HISTORY:  The patient is currently employed at [employer].   PHYSICAL EXAMINATION:  GENERAL:  She is noted to be a very thin female in no  acute distress.  VITAL SIGNS:  Five feet two inches, weight is 114 pounds.  Blood pressure  128/61, pulse of 80, respiratory rate of 20.  HEENT:  Negative.  No adenopathy.  NECK:  Supple.  Thyroid is nonpalpable.  LUNGS:  Clear.  CARDIOVASCULAR:  Regular rate and rhythm.  ABDOMEN:  Soft and nontender.  No surgical scars are identified.  EXTREMITIES:  Noted to be normal.  PELVIC:  Normal external genitalia.  There is a shortened perineal body  identified.  The cervix is noted to be without lesions.  The bladder is well  supported.  Initially the rectovaginal septum appears to be intact but  subsequent digital examination reveals decreased tone of the anal sphincter  and upwards of about 3 cm in the midline of the vaginal mucosa is a  fingertip sized rectovaginal fistula.  Surrounding tissue within the  rectovaginal septum likewise appears to be thinned on examination.   ASSESSMENT:  The patient with previous obstetrical injury with a midline  episiotomy with fourth degree perineal laceration which was appropriately  repaired at that time, which is a little unusual at this late time.  For  initial symptoms of the rectovaginal fistula to be apparent.  The patient  currently does not provide any appreciating history about any  gastrointestinal abnormalities thus there is no significant concern for any previously underlying colon disorder such as ulcerative  colitis or Crohn's  disease.   PLAN:  Pretreat the patient with empiric antibiotics as  well as laxatives on  a daily basis.  She will thereafter be referred to [facility] at  which time the planned repair of the rectovaginal fistula can be performed  on an outpatient basis with an overnight hospital stay anticipated.                                               Langley Gauss, M.D.    DC/MEDQ  D:  02/19/2003  T:  02/19/2003  Job:  [id number]

## 2011-12-15 ENCOUNTER — Other Ambulatory Visit: Payer: Self-pay | Admitting: Adult Health

## 2011-12-15 ENCOUNTER — Other Ambulatory Visit (HOSPITAL_COMMUNITY)
Admission: RE | Admit: 2011-12-15 | Discharge: 2011-12-15 | Disposition: A | Discharge status: Home / Self Care | Payer: Self-pay | Source: Ambulatory Visit | Attending: Obstetrics and Gynecology | Admitting: Obstetrics and Gynecology

## 2011-12-15 DIAGNOSIS — Z113 Encounter for screening for infections with a predominantly sexual mode of transmission: Secondary | ICD-10-CM | POA: Insufficient documentation

## 2011-12-15 DIAGNOSIS — Z01419 Encounter for gynecological examination (general) (routine) without abnormal findings: Secondary | ICD-10-CM | POA: Insufficient documentation

## 2012-01-16 ENCOUNTER — Other Ambulatory Visit: Payer: Self-pay | Admitting: Obstetrics & Gynecology

## 2012-01-23 ENCOUNTER — Encounter (HOSPITAL_COMMUNITY): Payer: Self-pay

## 2012-01-23 ENCOUNTER — Encounter (HOSPITAL_COMMUNITY)
Admission: RE | Admit: 2012-01-23 | Discharge: 2012-01-23 | Disposition: A | Discharge status: Home / Self Care | Payer: Medicaid Other | Source: Ambulatory Visit | Attending: Obstetrics & Gynecology | Admitting: Obstetrics & Gynecology

## 2012-01-23 ENCOUNTER — Other Ambulatory Visit: Payer: Self-pay | Admitting: Obstetrics & Gynecology

## 2012-01-23 LAB — CBC
Hemoglobin: 14.5 g/dL (ref 12.0–15.0)
MCH: 30.5 pg (ref 26.0–34.0)
MCHC: 35.8 g/dL (ref 30.0–36.0)
RDW: 12.9 % (ref 11.5–15.5)

## 2012-01-23 LAB — URINALYSIS, ROUTINE W REFLEX MICROSCOPIC
Nitrite: NEGATIVE
Specific Gravity, Urine: 1.01 (ref 1.005–1.030)
Urobilinogen, UA: 0.2 mg/dL (ref 0.0–1.0)

## 2012-01-23 LAB — HCG, QUANTITATIVE, PREGNANCY: hCG, Beta Chain, Quant, S: 1 m[IU]/mL (ref <5)

## 2012-01-23 LAB — COMPREHENSIVE METABOLIC PANEL
ALT: 105 U/L — ABNORMAL HIGH (ref 0–35)
Calcium: 10 mg/dL (ref 8.4–10.5)
GFR calc Af Amer: >90 mL/min (ref >90)
Glucose, Bld: 101 mg/dL — ABNORMAL HIGH (ref 70–99)
Sodium: 136 mEq/L (ref 135–145)
Total Protein: 6.8 g/dL (ref 6.0–8.3)

## 2012-01-23 LAB — SURGICAL PCR SCREEN
MRSA, PCR: NEGATIVE
Staphylococcus aureus: NEGATIVE

## 2012-01-23 NOTE — Patient Instructions (Addendum)
Nicole Bell  01/23/2012   Your procedure is scheduled on:  Wednesday, 01/25/12  Report to [facility] at 0940 AM.  Call this number if you have problems the morning of surgery: [phone]   Remember:   Do not eat food:After Midnight.  May have clear liquids:until Midnight .  Clear liquids include soda, tea, black coffee, apple or grape juice, broth.  Take these medicines the morning of surgery with A SIP OF WATER:    Do not wear jewelry, make-up or nail polish.  Do not wear lotions, powders, or perfumes. You may wear deodorant.  Do not shave 48 hours prior to surgery.  Do not bring valuables to the hospital.  Contacts, dentures or bridgework may not be worn into surgery.  Leave suitcase in the car. After surgery it may be brought to your room.  For patients admitted to the hospital, checkout time is 11:00 AM the day of discharge.   Patients discharged the day of surgery will not be allowed to drive home.  Name and phone number of your driver: driver  Special Instructions: CHG Shower Use Special Wash: 1/2 bottle night before surgery and 1/2 bottle morning of surgery.   Please read over the following fact sheets that you were given: Pain Booklet, MRSA Information, Surgical Site Infection Prevention, Anesthesia Post-op Instructions and Care and Recovery After Surgery   Conization of the Cervix Conization is the cutting (excision) of a cone-shaped portion of the cervix. This procedure is usually done when there is abnormal bleeding from the cervix. It can also be done to evaluate an abnormal Pap smear or if an abnormality is seen on the cervix during an exam. Conization of the cervix is not done during a menstrual period or pregnancy. BEFORE THE PROCEDURE  Do not eat or drink anything for 6 to 8 hours before the procedure, especially if you are going to be given a drug to make you sleep (general anesthetic).   Do not take aspirin or blood thinners for at least a week before the procedure,  or as directed.   Arrive at least an hour before the procedure to read and sign the necessary forms.   Arrange for someone to take you home after the procedure.   If you smoke, do not smoke for 2 weeks before the procedure.  LET YOUR CAREGIVER KNOW THE FOLLOWING:  Allergies to food or medications.   All the medications you are taking including over-the-counter and prescription medications, herbs, eyedrops, and creams.   You develop a cold or an infection.   If you are using illegal drugs or drinking too much alcohol.   Your smoking habits.   Previous problems with anesthetics including novocaine.   The possibility of being pregnant.   History of blood clots or other bleeding problems.   Other medical problems.  RISKS AND COMPLICATIONS   Bleeding.   Infection.   Damage to the cervix.   Injury to surrounding organs.   Problems with the anesthesia.  PROCEDURE Conization of the cervix can be done by:  Cold knife. This type cuts out the cervical canal and the transformation zone (where the normal cells end and the abnormal cells begin) with a scalpel.   LEEP (electrocautery). This type is done with a thin wire that can cut and burn (cauterize) the cervical tissue with an electrical current.   Laser treatment. This type cuts and burns (cauterizes) the tissue of the cervix to prevent bleeding with a laser beam.  You will be given a drug to make you sleep (general anesthetic) for cold knife and laser treatments, and you will be given a numbing medication (local anesthetic) for the LEEP procedure. Conization is usually done using colposcopy. Colposcopy magnifies the cervix to see it more clearly. The tissue removed is examined under a microscope by a doctor Psychologist, educational). The pathologist will provide a report to your caregiver. This will help your caregiver decide if further treatment is necessary. This report will also help your caregiver decide on the best treatment if your  results are abnormal.  AFTER THE PROCEDURE  If you had a general anesthetic, you may be groggy for a couple of hours after the procedure.   If you had a local anesthetic, you will be advised to rest at the surgical center or caregiver's office until you are stable and feel ready to go home.   Have someone take you home.   You may have some cramping for a couple of days.   You may have a bloody discharge or light bleeding for 2 to 4 weeks.   You may have a black discharge coming from the vagina. This is from the paste used on the cervix to prevent bleeding. This is normal discharge.  HOME CARE INSTRUCTIONS   Do not drive for 24 hours.   Avoid strenuous activities and exercises for at least 7 - 10 days.   Only take over-the-counter or prescription medicines for pain, discomfort, or fever as directed by your caregiver.   Do not take aspirin. It can cause or aggravate bleeding.   You may resume your usual diet.   Drink 6 to 8 glasses of fluid a day.   Rest and sleep the first 24 to 48 hours.   Take showers instead of baths until your caregiver gives you the okay.   Do not use tampons, douche or have intercourse for 4 weeks, or as advised by your caregiver.   Do not lift anything over 10 pounds (4.5 kg) for at least 7 to 10 days, or as advised by your caregiver.   Take your temperature twice a day, for 4 to 5 days. Write them down.   Do not drink or drive when taking pain medication.   If you develop constipation:   Take a mild laxative with the advice of your caregiver.   Eat bran foods.   Drink a lot of fluids.   Try to have someone with you or available for you the first 24 to 48 hours, especially if you had a general anesthetic.   Make sure you and your family understand everything about your surgery and recovery.   Follow your caregiver's advice regarding follow-up appointments and Pap smears.  SEEK MEDICAL CARE IF:   You develop a rash.   You are dizzy or  light-headed.   You feel sick to your stomach (nauseous).   You develop a bad smelling vaginal discharge.   You have an abnormal reaction or allergy to your medication.   You need stronger pain medication.  SEEK IMMEDIATE MEDICAL CARE IF:   You have blood clots or bleeding that is heavier than a normal menstrual period, or you develop bright red bleeding.   An oral temperature above 102 F (38.9 C) develops and persists for several hours.   You have increasing cramps.   You pass out.   You have painful or bloody urine.   You start throwing up (vomiting).   The pain is not relieved with  your pain medication.  Not all test results are available during your visit. If your test results are not back during your the visit, make an appointment with your caregiver to find out the results. Do not assume everything is normal if you have not heard from your caregiver or the medical facility. It is important for you to follow up on all of your test results. Document Released: 06/15/2005 Document Revised: 08/25/2011 Document Reviewed: 04/06/2009 [facility] Patient Information 2012 [place], [place].PATIENT INSTRUCTIONS POST-ANESTHESIA  IMMEDIATELY FOLLOWING SURGERY:  Do not drive or operate machinery for the first twenty four hours after surgery.  Do not make any important decisions for twenty four hours after surgery or while taking narcotic pain medications or sedatives.  If you develop intractable nausea and vomiting or a severe headache please notify your doctor immediately.  FOLLOW-UP:  Please make an appointment with your surgeon as instructed. You do not need to follow up with anesthesia unless specifically instructed to do so.  WOUND CARE INSTRUCTIONS (if applicable):  Keep a dry clean dressing on the anesthesia/puncture wound site if there is drainage.  Once the wound has quit draining you may leave it open to air.  Generally you should leave the bandage intact for twenty four hours unless  there is drainage.  If the epidural site drains for more than 36-48 hours please call the anesthesia department.  QUESTIONS?:  Please feel free to call your physician or the hospital operator if you have any questions, and they will be happy to assist you.

## 2012-01-25 ENCOUNTER — Encounter (HOSPITAL_COMMUNITY): Payer: Self-pay | Admitting: *Deleted

## 2012-01-25 ENCOUNTER — Ambulatory Visit (HOSPITAL_COMMUNITY): Payer: Medicaid Other | Admitting: Anesthesiology

## 2012-01-25 ENCOUNTER — Ambulatory Visit (HOSPITAL_COMMUNITY)
Admission: RE | Admit: 2012-01-25 | Discharge: 2012-01-25 | Disposition: A | Discharge status: Home / Self Care | Payer: Medicaid Other | Source: Ambulatory Visit | Attending: Obstetrics & Gynecology | Admitting: Obstetrics & Gynecology

## 2012-01-25 ENCOUNTER — Encounter (HOSPITAL_COMMUNITY)
Admission: RE | Disposition: A | Discharge status: Home / Self Care | Payer: Self-pay | Source: Ambulatory Visit | Attending: Obstetrics & Gynecology

## 2012-01-25 ENCOUNTER — Encounter (HOSPITAL_COMMUNITY): Payer: Self-pay | Admitting: Anesthesiology

## 2012-01-25 DIAGNOSIS — N879 Dysplasia of cervix uteri, unspecified: Secondary | ICD-10-CM

## 2012-01-25 DIAGNOSIS — N871 Moderate cervical dysplasia: Secondary | ICD-10-CM | POA: Insufficient documentation

## 2012-01-25 DIAGNOSIS — Z01812 Encounter for preprocedural laboratory examination: Secondary | ICD-10-CM | POA: Insufficient documentation

## 2012-01-25 HISTORY — PX: CERVICAL CONIZATION W/BX: SHX1330

## 2012-01-25 SURGERY — CONE BIOPSY, CERVIX
Anesthesia: General | Site: Cervix | Wound class: Clean Contaminated

## 2012-01-25 MED ORDER — MIDAZOLAM HCL 2 MG/2ML IJ SOLN
INTRAMUSCULAR | Status: AC
  Administered 2012-01-25: 2 mg via INTRAVENOUS
  Filled 2012-01-25: qty 2

## 2012-01-25 MED ORDER — ONDANSETRON HCL 4 MG/2ML IJ SOLN
INTRAMUSCULAR | Status: AC
  Administered 2012-01-25: 4 mg via INTRAVENOUS
  Filled 2012-01-25: qty 2

## 2012-01-25 MED ORDER — PROPOFOL 10 MG/ML IV BOLUS
INTRAVENOUS | Status: DC | PRN
  Administered 2012-01-25: 150 mg via INTRAVENOUS

## 2012-01-25 MED ORDER — ACETIC ACID 5 % SOLN
Status: DC | PRN
  Administered 2012-01-25: 1 via TOPICAL

## 2012-01-25 MED ORDER — ONDANSETRON HCL 4 MG/2ML IJ SOLN
4.0000 mg | Freq: Once | INTRAMUSCULAR | Status: AC
  Administered 2012-01-25: 4 mg via INTRAVENOUS

## 2012-01-25 MED ORDER — HYDROCODONE-ACETAMINOPHEN 5-500 MG PO TABS: 1.0000 | ORAL_TABLET | Freq: Four times a day (QID) | ORAL | Status: AC | PRN

## 2012-01-25 MED ORDER — LIDOCAINE HCL (PF) 1 % IJ SOLN
INTRAMUSCULAR | Status: AC
  Filled 2012-01-25: qty 2

## 2012-01-25 MED ORDER — FENTANYL CITRATE 0.05 MG/ML IJ SOLN
25.0000 ug | INTRAMUSCULAR | Status: DC | PRN
  Administered 2012-01-25 (×2): 50 ug via INTRAVENOUS

## 2012-01-25 MED ORDER — ONDANSETRON HCL 8 MG PO TABS: 8.0000 mg | ORAL_TABLET | Freq: Three times a day (TID) | ORAL | Status: AC | PRN

## 2012-01-25 MED ORDER — ONDANSETRON HCL 4 MG/2ML IJ SOLN: 4.0000 mg | Freq: Once | INTRAMUSCULAR | Status: DC | PRN

## 2012-01-25 MED ORDER — CEFAZOLIN SODIUM 1-5 GM-% IV SOLN
1.0000 g | INTRAVENOUS | Status: AC
  Administered 2012-01-25: 1 g via INTRAVENOUS

## 2012-01-25 MED ORDER — KETOROLAC TROMETHAMINE 30 MG/ML IJ SOLN: 30.0000 mg | Freq: Once | INTRAMUSCULAR | Status: DC

## 2012-01-25 MED ORDER — LACTATED RINGERS IV SOLN
INTRAVENOUS | Status: DC
  Administered 2012-01-25: 1000 mL via INTRAVENOUS

## 2012-01-25 MED ORDER — FENTANYL CITRATE 0.05 MG/ML IJ SOLN
INTRAMUSCULAR | Status: AC
  Administered 2012-01-25: 50 ug via INTRAVENOUS
  Filled 2012-01-25: qty 2

## 2012-01-25 MED ORDER — KETOROLAC TROMETHAMINE 10 MG PO TABS: 10.0000 mg | ORAL_TABLET | Freq: Three times a day (TID) | ORAL | Status: AC | PRN

## 2012-01-25 MED ORDER — MIDAZOLAM HCL 2 MG/2ML IJ SOLN
INTRAMUSCULAR | Status: AC
  Filled 2012-01-25: qty 2

## 2012-01-25 MED ORDER — LIDOCAINE HCL 1 % IJ SOLN
INTRAMUSCULAR | Status: DC | PRN
  Administered 2012-01-25: 50 mg via INTRADERMAL

## 2012-01-25 MED ORDER — MIDAZOLAM HCL 2 MG/2ML IJ SOLN
1.0000 mg | INTRAMUSCULAR | Status: DC | PRN
  Administered 2012-01-25 (×2): 2 mg via INTRAVENOUS

## 2012-01-25 MED ORDER — FENTANYL CITRATE 0.05 MG/ML IJ SOLN
INTRAMUSCULAR | Status: DC | PRN
  Administered 2012-01-25 (×2): 25 ug via INTRAVENOUS
  Administered 2012-01-25: 50 ug via INTRAVENOUS

## 2012-01-25 MED ORDER — CEFAZOLIN SODIUM 1-5 GM-% IV SOLN
INTRAVENOUS | Status: AC
  Administered 2012-01-25: 1 g via INTRAVENOUS
  Filled 2012-01-25: qty 50

## 2012-01-25 MED ORDER — LIDOCAINE HCL (PF) 1 % IJ SOLN
INTRAMUSCULAR | Status: AC
  Filled 2012-01-25: qty 5

## 2012-01-25 MED ORDER — FERRIC SUBSULFATE SOLN
Status: DC | PRN
  Administered 2012-01-25: 1

## 2012-01-25 MED ORDER — FENTANYL CITRATE 0.05 MG/ML IJ SOLN
INTRAMUSCULAR | Status: AC
  Filled 2012-01-25: qty 2

## 2012-01-25 MED ORDER — MIDAZOLAM HCL 5 MG/5ML IJ SOLN
INTRAMUSCULAR | Status: DC | PRN
  Administered 2012-01-25: 2 mg via INTRAVENOUS

## 2012-01-25 MED ORDER — FERRIC SUBSULFATE 259 MG/GM EX SOLN
CUTANEOUS | Status: AC
  Filled 2012-01-25: qty 8

## 2012-01-25 MED ORDER — KETOROLAC TROMETHAMINE 30 MG/ML IJ SOLN
30.0000 mg | Freq: Once | INTRAMUSCULAR | Status: AC
  Administered 2012-01-25: 30 mg via INTRAVENOUS

## 2012-01-25 MED ORDER — CEFAZOLIN SODIUM 1-5 GM-% IV SOLN
INTRAVENOUS | Status: DC | PRN
  Administered 2012-01-25: 1 g via INTRAVENOUS

## 2012-01-25 MED ORDER — KETOROLAC TROMETHAMINE 30 MG/ML IJ SOLN
INTRAMUSCULAR | Status: AC
  Administered 2012-01-25: 30 mg via INTRAVENOUS
  Filled 2012-01-25: qty 1

## 2012-01-25 MED ORDER — PROPOFOL 10 MG/ML IV EMUL
INTRAVENOUS | Status: AC
  Filled 2012-01-25: qty 20

## 2012-01-25 MED ORDER — WATER FOR IRRIGATION, STERILE IR SOLN
Status: DC | PRN
  Administered 2012-01-25: 1000 mL

## 2012-01-25 MED ORDER — LACTATED RINGERS IV SOLN: INTRAVENOUS | Status: DC

## 2012-01-25 SURGICAL SUPPLY — 29 items
APPLICATOR COTTON TIP 6IN STRL (MISCELLANEOUS) ×2 IMPLANT
CLOTH BEACON ORANGE TIMEOUT ST (SAFETY) ×2 IMPLANT
COAGULATOR SUCT SWTCH 10FR 6 (ELECTROSURGICAL) ×2 IMPLANT
COVER SURGICAL LIGHT HANDLE (MISCELLANEOUS) ×4 IMPLANT
ELECT REM PT RETURN 9FT ADLT (ELECTROSURGICAL)
ELECTRODE REM PT RTRN 9FT ADLT (ELECTROSURGICAL) IMPLANT
FORMALIN 10 PREFIL 120ML (MISCELLANEOUS) ×4 IMPLANT
GAUZE SPONGE 4X4 16PLY XRAY LF (GAUZE/BANDAGES/DRESSINGS) IMPLANT
GLOVE BIOGEL PI IND STRL 8 (GLOVE) ×1 IMPLANT
GLOVE BIOGEL PI INDICATOR 8 (GLOVE) ×1
GLOVE ECLIPSE 6.5 STRL STRAW (GLOVE) ×4 IMPLANT
GLOVE ECLIPSE 8.0 STRL XLNG CF (GLOVE) ×2 IMPLANT
GLOVE INDICATOR 7.0 STRL GRN (GLOVE) ×4 IMPLANT
GOWN STRL REIN XL XLG (GOWN DISPOSABLE) ×6 IMPLANT
KIT ROOM TURNOVER APOR (KITS) ×2 IMPLANT
LASER FIBER DISP 1000U (UROLOGICAL SUPPLIES) ×2 IMPLANT
MANIFOLD NEPTUNE II (INSTRUMENTS) ×2 IMPLANT
NS IRRIG 1000ML POUR BTL (IV SOLUTION) IMPLANT
PACK BASIC III (CUSTOM PROCEDURE TRAY) ×1
PACK PERI GYN (CUSTOM PROCEDURE TRAY) ×2 IMPLANT
PACK SRG BSC III STRL LF ECLPS (CUSTOM PROCEDURE TRAY) ×1 IMPLANT
PAD ARMBOARD 7.5X6 YLW CONV (MISCELLANEOUS) ×2 IMPLANT
PREFILTER SMOKE EVAC (FILTER) ×2 IMPLANT
SET BASIN LINEN APH (SET/KITS/TRAYS/PACK) ×2 IMPLANT
SUT VIC AB 0 CT1 27 (SUTURE)
SUT VIC AB 0 CT1 27XCR 8 STRN (SUTURE) IMPLANT
TOWEL OR 17X26 4PK STRL BLUE (TOWEL DISPOSABLE) ×2 IMPLANT
TUBING SMOKE EVAC CO2 (TUBING) ×2 IMPLANT
WATER STERILE IRR 1000ML POUR (IV SOLUTION) ×2 IMPLANT

## 2012-01-25 NOTE — Discharge Instructions (Signed)
Conization of the Cervix Conization is the cutting (excision) of a cone-shaped portion of the cervix. This procedure is usually done when there is abnormal bleeding from the cervix. It can also be done to evaluate an abnormal Pap smear or if an abnormality is seen on the cervix during an exam. Conization of the cervix is not done during a menstrual period or pregnancy. BEFORE THE PROCEDURE  Do not eat or drink anything for 6 to 8 hours before the procedure, especially if you are going to be given a drug to make you sleep (general anesthetic).   Do not take aspirin or blood thinners for at least a week before the procedure, or as directed.   Arrive at least an hour before the procedure to read and sign the necessary forms.   Arrange for someone to take you home after the procedure.   If you smoke, do not smoke for 2 weeks before the procedure.  LET YOUR CAREGIVER KNOW THE FOLLOWING:  Allergies to food or medications.   All the medications you are taking including over-the-counter and prescription medications, herbs, eyedrops, and creams.   You develop a cold or an infection.   If you are using illegal drugs or drinking too much alcohol.   Your smoking habits.   Previous problems with anesthetics including novocaine.   The possibility of being pregnant.   History of blood clots or other bleeding problems.   Other medical problems.  RISKS AND COMPLICATIONS   Bleeding.   Infection.   Damage to the cervix.   Injury to surrounding organs.   Problems with the anesthesia.  PROCEDURE Conization of the cervix can be done by:  Cold knife. This type cuts out the cervical canal and the transformation zone (where the normal cells end and the abnormal cells begin) with a scalpel.   LEEP (electrocautery). This type is done with a thin wire that can cut and burn (cauterize) the cervical tissue with an electrical current.   Laser treatment. This type cuts and burns (cauterizes) the  tissue of the cervix to prevent bleeding with a laser beam.  You will be given a drug to make you sleep (general anesthetic) for cold knife and laser treatments, and you will be given a numbing medication (local anesthetic) for the LEEP procedure. Conization is usually done using colposcopy. Colposcopy magnifies the cervix to see it more clearly. The tissue removed is examined under a microscope by a doctor (pathologist). The pathologist will provide a report to your caregiver. This will help your caregiver decide if further treatment is necessary. This report will also help your caregiver decide on the best treatment if your results are abnormal.  AFTER THE PROCEDURE  If you had a general anesthetic, you may be groggy for a couple of hours after the procedure.   If you had a local anesthetic, you will be advised to rest at the surgical center or caregiver's office until you are stable and feel ready to go home.   Have someone take you home.   You may have some cramping for a couple of days.   You may have a bloody discharge or light bleeding for 2 to 4 weeks.   You may have a black discharge coming from the vagina. This is from the paste used on the cervix to prevent bleeding. This is normal discharge.  HOME CARE INSTRUCTIONS   Do not drive for 24 hours.   Avoid strenuous activities and exercises for at least 7 -   10 days.   Only take over-the-counter or prescription medicines for pain, discomfort, or fever as directed by your caregiver.   Do not take aspirin. It can cause or aggravate bleeding.   You may resume your usual diet.   Drink 6 to 8 glasses of fluid a day.   Rest and sleep the first 24 to 48 hours.   Take showers instead of baths until your caregiver gives you the okay.   Do not use tampons, douche or have intercourse for 4 weeks, or as advised by your caregiver.   Do not lift anything over 10 pounds (4.5 kg) for at least 7 to 10 days, or as advised by your caregiver.     Take your temperature twice a day, for 4 to 5 days. Write them down.   Do not drink or drive when taking pain medication.   If you develop constipation:   Take a mild laxative with the advice of your caregiver.   Eat bran foods.   Drink a lot of fluids.   Try to have someone with you or available for you the first 24 to 48 hours, especially if you had a general anesthetic.   Make sure you and your family understand everything about your surgery and recovery.   Follow your caregiver's advice regarding follow-up appointments and Pap smears.  SEEK MEDICAL CARE IF:   You develop a rash.   You are dizzy or light-headed.   You feel sick to your stomach (nauseous).   You develop a bad smelling vaginal discharge.   You have an abnormal reaction or allergy to your medication.   You need stronger pain medication.  SEEK IMMEDIATE MEDICAL CARE IF:   You have blood clots or bleeding that is heavier than a normal menstrual period, or you develop bright red bleeding.   An oral temperature above 102 F (38.9 C) develops and persists for several hours.   You have increasing cramps.   You pass out.   You have painful or bloody urine.   You start throwing up (vomiting).   The pain is not relieved with your pain medication.  Not all test results are available during your visit. If your test results are not back during your the visit, make an appointment with your caregiver to find out the results. Do not assume everything is normal if you have not heard from your caregiver or the medical facility. It is important for you to follow up on all of your test results. Document Released: 06/15/2005 Document Revised: 08/25/2011 Document Reviewed: 04/06/2009 [employer] Patient Information 2012 [employer]. 

## 2012-01-25 NOTE — Anesthesia Preprocedure Evaluation (Addendum)
Anesthesia Evaluation  Patient identified by MRN, date of birth, ID band Patient awake    Reviewed: Allergy & Precautions, H&P , NPO status , Patient's Chart, lab work & pertinent test results  History of Anesthesia Complications Negative for: history of anesthetic complications  Airway Mallampati: I      Dental  (+) Teeth Intact   Pulmonary Current Smoker,  breath sounds clear to auscultation        Cardiovascular negative cardio ROS  Rhythm:Regular     Neuro/Psych    GI/Hepatic   Endo/Other  Hyperthyroidism   Renal/GU      Musculoskeletal   Abdominal   Peds  Hematology   Anesthesia Other Findings   Reproductive/Obstetrics                          Anesthesia Physical Anesthesia Plan  ASA: II  Anesthesia Plan: General   Post-op Pain Management:    Induction: Intravenous  Airway Management Planned: LMA  Additional Equipment:   Intra-op Plan:   Post-operative Plan: Extubation in OR  Informed Consent: I have reviewed the patients History and Physical, chart, labs and discussed the procedure including the risks, benefits and alternatives for the proposed anesthesia with the patient or authorized representative who has indicated his/her understanding and acceptance.     Plan Discussed with:   Anesthesia Plan Comments:         Anesthesia Quick Evaluation

## 2012-01-25 NOTE — Transfer of Care (Signed)
Immediate Anesthesia Transfer of Care Note  Patient: Nicole Bell  Procedure(s) Performed: Procedure(s) (LRB): CONIZATION CERVIX WITH BIOPSY (N/A) HOLMIUM LASER APPLICATION (N/A)  Patient Location: PACU  Anesthesia Type: General  Level of Consciousness: awake and patient cooperative  Airway & Oxygen Therapy: Patient Spontanous Breathing and Patient connected to face mask oxygen  Post-op Assessment: Report given to PACU RN, Post -op Vital signs reviewed and stable and Patient moving all extremities  Post vital signs: Reviewed and stable  Complications: No apparent anesthesia complications

## 2012-01-25 NOTE — Op Note (Signed)
Preoperative diagnosis:  1.  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  Large lesion                                         3.  Inadequate colposcopy with lesion extending into endocervical canal  Postoperative Diagnosis:  Same as above  Procedure:  Cervical conization using laser,  ablation of cervical bed using laser  Surgeon:  Rockne Coons MD  Anesthesia:  Laryngeal mask airway  Findings:    I performed a colposcopy in the office and found a endocervical lesion.  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  As a result we decided to proceed with a conization of the cervix for both therapeutic and diagnostic reasons.  Also the lesion extended into the endocervical canal and so it was an inadequate colposcopy.  Today at the time of surgery a repeat colposcopy was performed using 3% acetic acid and the lesion was once again confirmed.  There  were no new findings today.  Description of operation:  Patient was taken to the operating room and placed in the supine position where she underwent laryngeal mask airway anesthesia.  She was then placed in the high lithotomy position using candy cane stirrups.  She was then draped out for a laser procedure.  The microscope was used and 3% acetic acid was placed on the cervix.  The holmium laser was then employed at a power of 2.5 and rates between 8 and 15.  I achieved a couple millimeter margin around lesions both at 12:00 and 6:00 the laser was used to perform a conization.  The specimen was removed and sent to pathology for evaluation.  As is always the case with laser I did achieve at an appropriate margin around the disease with shrinkage of the tissue during the procedure it may appear to be a positive lateral margin.  However the surgical margin is indeed clear.  I then used the laser to ablate the conization  bed to a depth of 5-7 mm laterally coning down to 9 mm centrally and began getting good surgical margin.  Additional hemostasis was achieved using Monsel solution.  In the conization bed was completely hemostatic.  Blood loss for the procedure was none.  The patient received 400 mg of ciprofloxacin and 900 mg of Cleocin preoperatively.  The patient was awakened from anesthesia and taken to the recovery room in good stable condition with all counts being correct.  She will be followed up in the office in one month for evaluation of the conization bed.  Kayelyn Lemon H 12:10 PM 01/25/2012

## 2012-01-25 NOTE — H&P (Signed)
Nicole Bell is an 31 y.o. female currently taking depo provera who had an abnormal pap smear.  I subsequently performed a colposcopy with biopsy and the lesion is endocervical and the colposcopy is inadequate with lesion extending beyond my vision.  In addition the biopsy returned as moderate dysplasia with endocervical glandular involvement.  As a result she is admitted for outpatient laser conization of cervix both for diagnostic and therapeutic indications.    Past Medical History  Diagnosis Date  . Hyperthyroidism 12/28/2009    Past Surgical History  Procedure Date  . Rectovaginal fistula closure 2004  . Cholecystectomy 2010    [place]    Family History  Problem Relation Age of Onset  . Anesthesia problems Neg Hx   . Hypotension Neg Hx   . Malignant hyperthermia Neg Hx   . Pseudochol deficiency Neg Hx     Social History:  reports that she has been smoking Cigarettes.  She has a 3.75 pack-year smoking history. She does not have any smokeless tobacco history on file. She reports that she does not drink alcohol or use illicit drugs.  Allergies: No Known Allergies  Prescriptions prior to admission  Medication Sig Dispense Refill  . cyanocobalamin (,VITAMIN B-12,) 1000 MCG/ML injection Inject 1,000 mcg into the muscle every 30 (thirty) days.        . medroxyPROGESTERone (DEPO-PROVERA) 150 MG/ML injection Inject 150 mg into the muscle every 3 (three) months.          ROS  Review of Systems  Constitutional: Negative for fever, chills, weight loss, malaise/fatigue and diaphoresis.  HENT: Negative for hearing loss, ear pain, nosebleeds, congestion, sore throat, neck pain, tinnitus and ear discharge.   Eyes: Negative for blurred vision, double vision, photophobia, pain, discharge and redness.  Respiratory: Negative for cough, hemoptysis, sputum production, shortness of breath, wheezing and stridor.   Cardiovascular: Negative for chest pain, palpitations, orthopnea,  claudication, leg swelling and PND.  Gastrointestinal: negative for abdominal pain. Negative for heartburn, nausea, vomiting, diarrhea, constipation, blood in stool and melena.  Genitourinary: Negative for dysuria, urgency, frequency, hematuria and flank pain.  Musculoskeletal: Negative for myalgias, back pain, joint pain and falls.  Skin: Negative for itching and rash.  Neurological: Negative for dizziness, tingling, tremors, sensory change, speech change, focal weakness, seizures, loss of consciousness, weakness and headaches.  Endo/Heme/Allergies: Negative for environmental allergies and polydipsia. Does not bruise/bleed easily.  Psychiatric/Behavioral: Negative for depression, suicidal ideas, hallucinations, memory loss and substance abuse. The patient is not nervous/anxious and does not have insomnia.      Blood pressure 107/73, pulse 84, temperature 98.2 F (36.8 C), temperature source Oral, resp. rate 25, SpO2 99.00%. Physical Exam Physical Exam  Vitals reviewed. Constitutional: She is oriented to person, place, and time. She appears well-developed and well-nourished.  HENT:  Head: Normocephalic and atraumatic.  Right Ear: External ear normal.  Left Ear: External ear normal.  Nose: Nose normal.  Mouth/Throat: Oropharynx is clear and moist.  Eyes: Conjunctivae and EOM are normal. Pupils are equal, round, and reactive to light. Right eye exhibits no discharge. Left eye exhibits no discharge. No scleral icterus.  Neck: Normal range of motion. Neck supple. No tracheal deviation present. No thyromegaly present.  Cardiovascular: Normal rate, regular rhythm, normal heart sounds and intact distal pulses.  Exam reveals no gallop and no friction rub.   No murmur heard. Respiratory: Effort normal and breath sounds normal. No respiratory distress. She has no wheezes. She has no rales. She exhibits no  tenderness.  GI: Soft. Bowel sounds are normal. She exhibits no distension and no mass. There  is tenderness. There is no rebound and no guarding.  Genitourinary:       Vulva is normal without lesions Vagina is pink moist without discharge Cervix normal in appearance and pap is abnormal Uterus is normal Adnexa is negative with normal sized ovaries by sonogram  Musculoskeletal: Normal range of motion. She exhibits no edema and no tenderness.  Neurological: She is alert and oriented to person, place, and time. She has normal reflexes. She displays normal reflexes. No cranial nerve deficit. She exhibits normal muscle tone. Coordination normal.  Skin: Skin is warm and dry. No rash noted. No erythema. No pallor.  Psychiatric: She has a normal mood and affect. Her behavior is normal. Judgment and thought content normal.   Recent Results (from the past 336 hour(s))  CBC   Collection Time   01/23/12  2:55 PM      Component Value Range   WBC 6.7  4.0 - 10.5 (K/uL)   RBC 4.76  3.87 - 5.11 (MIL/uL)   Hemoglobin 14.5  12.0 - 15.0 (g/dL)   HCT 16.1  09.6 - 04.5 (%)   MCV 85.1  78.0 - 100.0 (fL)   MCH 30.5  26.0 - 34.0 (pg)   MCHC 35.8  30.0 - 36.0 (g/dL)   RDW 40.9  81.1 - 91.4 (%)   Platelets 170  150 - 400 (K/uL)  COMPREHENSIVE METABOLIC PANEL   Collection Time   01/23/12  2:55 PM      Component Value Range   Sodium 136  135 - 145 (mEq/L)   Potassium 3.5  3.5 - 5.1 (mEq/L)   Chloride 100  96 - 112 (mEq/L)   CO2 24  19 - 32 (mEq/L)   Glucose, Bld 101 (*) 70 - 99 (mg/dL)   BUN 10  6 - 23 (mg/dL)   Creatinine, Ser 7.82  0.50 - 1.10 (mg/dL)   Calcium 95.6  8.4 - 10.5 (mg/dL)   Total Protein 6.8  6.0 - 8.3 (g/dL)   Albumin 4.0  3.5 - 5.2 (g/dL)   AST 45 (*) 0 - 37 (U/L)   ALT 105 (*) 0 - 35 (U/L)   Alkaline Phosphatase 87  39 - 117 (U/L)   Total Bilirubin 0.3  0.3 - 1.2 (mg/dL)   GFR calc non Af Amer >90  >90 (mL/min)   GFR calc Af Amer >90  >90 (mL/min)  HCG, QUANTITATIVE, PREGNANCY   Collection Time   01/23/12  2:55 PM      Component Value Range   hCG, Beta Chain, Quant, S 1  <5  (mIU/mL)  URINALYSIS, ROUTINE W REFLEX MICROSCOPIC   Collection Time   01/23/12  3:03 PM      Component Value Range   Color, Urine YELLOW  YELLOW    APPearance CLOUDY (*) CLEAR    Specific Gravity, Urine 1.010  1.005 - 1.030    pH 7.0  5.0 - 8.0    Glucose, UA NEGATIVE  NEGATIVE (mg/dL)   Hgb urine dipstick TRACE (*) NEGATIVE    Bilirubin Urine NEGATIVE  NEGATIVE    Ketones, ur NEGATIVE  NEGATIVE (mg/dL)   Protein, ur NEGATIVE  NEGATIVE (mg/dL)   Urobilinogen, UA 0.2  0.0 - 1.0 (mg/dL)   Nitrite NEGATIVE  NEGATIVE    Leukocytes, UA SMALL (*) NEGATIVE   URINE MICROSCOPIC-ADD ON   Collection Time   01/23/12  3:03 PM  Component Value Range   Squamous Epithelial / LPF MANY (*) RARE    WBC, UA 3-6  <3 (WBC/hpf)   RBC / HPF 0-2  <3 (RBC/hpf)   Bacteria, UA FEW (*) RARE   SURGICAL PCR SCREEN   Collection Time   01/23/12  3:04 PM      Component Value Range   MRSA, PCR NEGATIVE  NEGATIVE    Staphylococcus aureus NEGATIVE  NEGATIVE          Assessment/Plan: 1.  Moderate squamous dysplasia 2.  Inadequate colposcopy 3.  Endocervical glandular involvement  Patient understands indications and risks of surgery.  She will proceed.  Palmina Clodfelter H 01/25/2012, 10:57 AM

## 2012-01-25 NOTE — Anesthesia Postprocedure Evaluation (Signed)
  Anesthesia Post-op Note  Patient: Nicole Bell  Procedure(s) Performed: Procedure(s) (LRB): CONIZATION CERVIX WITH BIOPSY (N/A) HOLMIUM LASER APPLICATION (N/A)  Patient Location: PACU  Anesthesia Type: General  Level of Consciousness: awake, alert , oriented and patient cooperative  Airway and Oxygen Therapy: Patient Spontanous Breathing  Post-op Pain: none  Post-op Assessment: Post-op Vital signs reviewed, Patient's Cardiovascular Status Stable, Respiratory Function Stable, Patent Airway, No signs of Nausea or vomiting and Pain level controlled  Post-op Vital Signs: Reviewed and stable  Complications: No apparent anesthesia complications

## 2012-01-25 NOTE — Anesthesia Procedure Notes (Signed)
Procedure Name: LMA Insertion Date/Time: 01/25/2012 11:37 AM Performed by: Despina Hidden Pre-anesthesia Checklist: Patient identified, Patient being monitored, Emergency Drugs available and Suction available Patient Re-evaluated:Patient Re-evaluated prior to inductionOxygen Delivery Method: Circle system utilized Preoxygenation: Pre-oxygenation with 100% oxygen Intubation Type: IV induction Ventilation: Mask ventilation without difficulty LMA Size: 3.0 Tube type: Oral Number of attempts: 1 Placement Confirmation: breath sounds checked- equal and bilateral and positive ETCO2 Tube secured with: Tape Dental Injury: Teeth and Oropharynx as per pre-operative assessment

## 2012-01-30 ENCOUNTER — Encounter (HOSPITAL_COMMUNITY): Payer: Self-pay | Admitting: Obstetrics & Gynecology

## 2013-01-01 ENCOUNTER — Ambulatory Visit: Payer: Self-pay

## 2013-01-03 ENCOUNTER — Encounter: Payer: Self-pay | Admitting: *Deleted

## 2013-01-07 ENCOUNTER — Encounter: Payer: Self-pay | Admitting: Obstetrics & Gynecology

## 2013-01-07 ENCOUNTER — Ambulatory Visit (INDEPENDENT_AMBULATORY_CARE_PROVIDER_SITE_OTHER): Payer: Medicaid Other | Admitting: Obstetrics & Gynecology

## 2013-01-07 VITALS — BP 98/66 | Ht 63.0 in | Wt 135.0 lb

## 2013-01-07 DIAGNOSIS — Z3202 Encounter for pregnancy test, result negative: Secondary | ICD-10-CM

## 2013-01-07 DIAGNOSIS — Z309 Encounter for contraceptive management, unspecified: Secondary | ICD-10-CM

## 2013-01-07 DIAGNOSIS — Z3049 Encounter for surveillance of other contraceptives: Secondary | ICD-10-CM

## 2013-01-07 LAB — POCT URINE PREGNANCY: Preg Test, Ur: NEGATIVE

## 2013-01-07 MED ORDER — MEDROXYPROGESTERONE ACETATE 150 MG/ML IM SUSP
150.0000 mg | Freq: Once | INTRAMUSCULAR | Status: AC
  Administered 2013-01-07: 150 mg via INTRAMUSCULAR

## 2013-04-01 ENCOUNTER — Encounter: Payer: Self-pay | Admitting: Adult Health

## 2013-04-01 ENCOUNTER — Ambulatory Visit (INDEPENDENT_AMBULATORY_CARE_PROVIDER_SITE_OTHER): Payer: Medicaid Other | Admitting: Adult Health

## 2013-04-01 VITALS — BP 102/70 | Ht 63.0 in | Wt 137.0 lb

## 2013-04-01 DIAGNOSIS — Z309 Encounter for contraceptive management, unspecified: Secondary | ICD-10-CM

## 2013-04-01 DIAGNOSIS — Z3202 Encounter for pregnancy test, result negative: Secondary | ICD-10-CM

## 2013-04-01 DIAGNOSIS — Z3200 Encounter for pregnancy test, result unknown: Secondary | ICD-10-CM

## 2013-04-01 DIAGNOSIS — Z3049 Encounter for surveillance of other contraceptives: Secondary | ICD-10-CM

## 2013-04-01 LAB — POCT URINE PREGNANCY: Preg Test, Ur: NEGATIVE

## 2013-04-01 MED ORDER — MEDROXYPROGESTERONE ACETATE 150 MG/ML IM SUSP
150.0000 mg | Freq: Once | INTRAMUSCULAR | Status: AC
  Administered 2013-04-01: 150 mg via INTRAMUSCULAR

## 2013-04-24 ENCOUNTER — Encounter: Payer: Self-pay | Admitting: General Practice

## 2013-04-24 ENCOUNTER — Ambulatory Visit (INDEPENDENT_AMBULATORY_CARE_PROVIDER_SITE_OTHER): Payer: Medicaid Other | Admitting: General Practice

## 2013-04-24 VITALS — BP 114/80 | HR 49 | Temp 98.1°F | Ht 62.0 in | Wt 134.0 lb

## 2013-04-24 DIAGNOSIS — J322 Chronic ethmoidal sinusitis: Secondary | ICD-10-CM

## 2013-04-24 MED ORDER — AMOXICILLIN-POT CLAVULANATE 875-125 MG PO TABS: 1.0000 | ORAL_TABLET | Freq: Two times a day (BID) | ORAL | Status: DC

## 2013-04-24 NOTE — Patient Instructions (Addendum)

## 2013-04-24 NOTE — Progress Notes (Signed)
  Subjective:    Patient ID: Nicole Bell, female    DOB: 17-May-1981, 32 y.o.   MRN: [id number]  Sinusitis This is a new problem. The current episode started in the past 7 days. The problem has been gradually worsening since onset. There has been no fever. Her pain is at a severity of 0/10. Associated symptoms include sinus pressure. Pertinent negatives include no chills, congestion, coughing, headaches, neck pain, shortness of breath or sore throat. Past treatments include nothing.  Reports she had 4 wisdom teeth surgically extracted recently and had sinus infection on April 08, 2013. She was prescribed amoxicillin and sinus infection cleared up, but now returned.     Review of Systems  Constitutional: Negative for fever and chills.  HENT: Positive for sinus pressure. Negative for congestion, sore throat, neck pain and neck stiffness.   Respiratory: Negative for cough, chest tightness and shortness of breath.   Cardiovascular: Negative for chest pain and palpitations.  Genitourinary: Negative for difficulty urinating.  Neurological: Negative for dizziness, weakness and headaches.       Objective:   Physical Exam  Constitutional: She is oriented to person, place, and time. She appears well-developed and well-nourished.  HENT:  Head: Normocephalic and atraumatic.  Right Ear: External ear normal.  Left Ear: External ear normal.  Nose: Right sinus exhibits maxillary sinus tenderness. Left sinus exhibits no maxillary sinus tenderness and no frontal sinus tenderness.  Mouth/Throat: Oropharynx is clear and moist.  Eyes: Conjunctivae and EOM are normal. Pupils are equal, round, and reactive to light.  Neck: Normal range of motion. Neck supple. No thyromegaly present.  Cardiovascular: Normal rate, regular rhythm and normal heart sounds.   Pulmonary/Chest: Effort normal and breath sounds normal.  Lymphadenopathy:    She has no cervical adenopathy.  Neurological: She is alert and oriented to  person, place, and time.  Skin: Skin is warm and dry.  Psychiatric: She has a normal mood and affect.          Assessment & Plan:  1. Ethmoid sinusitis - amoxicillin-clavulanate (AUGMENTIN) 875-125 MG per tablet; Take 1 tablet by mouth 2 (two) times daily.  Dispense: 20 tablet; Refill: 0 -take medication as prescribed -new toothbrush in 3 days -Patient verbalized understanding -Coralie Keens, FNP-C

## 2013-06-10 ENCOUNTER — Other Ambulatory Visit: Payer: Medicaid Other | Admitting: Adult Health

## 2013-06-24 ENCOUNTER — Ambulatory Visit (INDEPENDENT_AMBULATORY_CARE_PROVIDER_SITE_OTHER): Payer: Medicaid Other | Admitting: Adult Health

## 2013-06-24 ENCOUNTER — Encounter: Payer: Self-pay | Admitting: Adult Health

## 2013-06-24 VITALS — BP 100/60 | Ht 63.0 in | Wt 137.0 lb

## 2013-06-24 DIAGNOSIS — Z3202 Encounter for pregnancy test, result negative: Secondary | ICD-10-CM

## 2013-06-24 DIAGNOSIS — Z3049 Encounter for surveillance of other contraceptives: Secondary | ICD-10-CM

## 2013-06-24 DIAGNOSIS — Z309 Encounter for contraceptive management, unspecified: Secondary | ICD-10-CM

## 2013-06-24 DIAGNOSIS — Z3200 Encounter for pregnancy test, result unknown: Secondary | ICD-10-CM

## 2013-06-24 LAB — POCT URINE PREGNANCY: Preg Test, Ur: NEGATIVE

## 2013-06-24 MED ORDER — MEDROXYPROGESTERONE ACETATE 150 MG/ML IM SUSP
150.0000 mg | Freq: Once | INTRAMUSCULAR | Status: AC
  Administered 2013-06-24: 150 mg via INTRAMUSCULAR

## 2013-07-22 ENCOUNTER — Telehealth: Payer: Self-pay | Admitting: General Practice

## 2013-07-22 ENCOUNTER — Encounter (INDEPENDENT_AMBULATORY_CARE_PROVIDER_SITE_OTHER): Payer: Self-pay

## 2013-07-22 ENCOUNTER — Ambulatory Visit (INDEPENDENT_AMBULATORY_CARE_PROVIDER_SITE_OTHER): Payer: Medicaid Other | Admitting: Family Medicine

## 2013-07-22 VITALS — BP 115/80 | HR 68 | Temp 98.5°F | Wt 134.8 lb

## 2013-07-22 DIAGNOSIS — B373 Candidiasis of vulva and vagina: Secondary | ICD-10-CM

## 2013-07-22 DIAGNOSIS — J322 Chronic ethmoidal sinusitis: Secondary | ICD-10-CM

## 2013-07-22 MED ORDER — AMOXICILLIN-POT CLAVULANATE 875-125 MG PO TABS: 1.0000 | ORAL_TABLET | Freq: Two times a day (BID) | ORAL | Status: DC

## 2013-07-22 MED ORDER — FLUCONAZOLE 150 MG PO TABS: 150.0000 mg | ORAL_TABLET | Freq: Once | ORAL | Status: DC

## 2013-07-22 MED ORDER — TRIAMCINOLONE ACETONIDE 40 MG/ML IJ SUSP
40.0000 mg | Freq: Once | INTRAMUSCULAR | Status: AC
  Administered 2013-07-22: 40 mg via INTRAMUSCULAR

## 2013-07-22 NOTE — Patient Instructions (Signed)

## 2013-07-22 NOTE — Progress Notes (Signed)
  Subjective:    Patient ID: Nicole Bell, female    DOB: Mar 29, 1981, 33 y.o.   MRN: [id number]  HPI This 32 y.o. female presents for evaluation of sinus pressure and mucopurulent nasal  Drainage.  She c/o facial pain and pressure.  She is having low grade fever.  She does Smoke.  She has hx of 3 sinus infections since having some wisdom teeth removed last year.   Review of Systems C/o sinus pressure and pain. No chest pain, SOB, HA, dizziness, vision change, N/V, diarrhea, constipation, dysuria, urinary urgency or frequency, myalgias, arthralgias or rash.     Objective:   Physical Exam Vital signs noted  Well developed well nourished female.  HEENT - Head atraumatic Normocephalic                Eyes - PERRLA, Conjuctiva - clear Sclera- Clear EOMI                Ears - EAC's Wnl TM's Wnl Gross Hearing WNL                Nose - Nares patent                 Throat - oropharanx wnl.                Face - Tenderness to palpation bilateral maxillary sinuses. Respiratory - Lungs CTA bilateral Cardiac - RRR S1 and S2 without murmur        Assessment & Plan:  Ethmoid sinusitis - Plan: amoxicillin-clavulanate (AUGMENTIN) 875-125 MG per tablet, fluconazole (DIFLUCAN) 150 MG tablet, triamcinolone acetonide (KENALOG-40) injection 40 mg  Vaginal candidiasis - Plan: amoxicillin-clavulanate (AUGMENTIN) 875-125 MG per tablet, fluconazole (DIFLUCAN) 150 MG tablet  Deatra Canter FNP

## 2013-07-22 NOTE — Telephone Encounter (Signed)
appt scheduled

## 2013-07-23 ENCOUNTER — Ambulatory Visit: Payer: Medicaid Other | Admitting: Family Medicine

## 2013-08-05 ENCOUNTER — Encounter: Payer: Self-pay | Admitting: Adult Health

## 2013-08-05 ENCOUNTER — Ambulatory Visit (INDEPENDENT_AMBULATORY_CARE_PROVIDER_SITE_OTHER): Payer: Medicaid Other | Admitting: Adult Health

## 2013-08-05 ENCOUNTER — Other Ambulatory Visit (HOSPITAL_COMMUNITY)
Admission: RE | Admit: 2013-08-05 | Discharge: 2013-08-05 | Disposition: A | Discharge status: Home / Self Care | Payer: Medicaid Other | Source: Ambulatory Visit | Attending: Adult Health | Admitting: Adult Health

## 2013-08-05 VITALS — BP 100/60 | HR 76 | Ht 62.0 in | Wt 133.0 lb

## 2013-08-05 DIAGNOSIS — Z0000 Encounter for general adult medical examination without abnormal findings: Secondary | ICD-10-CM

## 2013-08-05 DIAGNOSIS — F17200 Nicotine dependence, unspecified, uncomplicated: Secondary | ICD-10-CM | POA: Insufficient documentation

## 2013-08-05 DIAGNOSIS — Z113 Encounter for screening for infections with a predominantly sexual mode of transmission: Secondary | ICD-10-CM | POA: Insufficient documentation

## 2013-08-05 DIAGNOSIS — K589 Irritable bowel syndrome without diarrhea: Secondary | ICD-10-CM | POA: Insufficient documentation

## 2013-08-05 DIAGNOSIS — Z1151 Encounter for screening for human papillomavirus (HPV): Secondary | ICD-10-CM | POA: Insufficient documentation

## 2013-08-05 DIAGNOSIS — Z01419 Encounter for gynecological examination (general) (routine) without abnormal findings: Secondary | ICD-10-CM | POA: Insufficient documentation

## 2013-08-05 DIAGNOSIS — E0590 Thyrotoxicosis, unspecified without thyrotoxic crisis or storm: Secondary | ICD-10-CM

## 2013-08-05 DIAGNOSIS — Z309 Encounter for contraceptive management, unspecified: Secondary | ICD-10-CM | POA: Insufficient documentation

## 2013-08-05 HISTORY — DX: Nicotine dependence, unspecified, uncomplicated: F17.200

## 2013-08-05 LAB — CBC
HCT: 42.5 % (ref 36.0–46.0)
Hemoglobin: 14.7 g/dL (ref 12.0–15.0)
MCV: 85.7 fL (ref 78.0–100.0)
RBC: 4.96 MIL/uL (ref 3.87–5.11)
WBC: 9.9 10*3/uL (ref 4.0–10.5)

## 2013-08-05 MED ORDER — HYOSCYAMINE SULFATE 0.125 MG PO TABS: 0.1250 mg | ORAL_TABLET | ORAL | Status: DC | PRN

## 2013-08-05 MED ORDER — MEDROXYPROGESTERONE ACETATE 150 MG/ML IM SUSP: 150.0000 mg | INTRAMUSCULAR | Status: DC

## 2013-08-05 NOTE — Patient Instructions (Signed)
Physical in 1 year Follow up labs in 24 -48 hours about labs Depo as scheduled  Irritable Bowel Syndrome Irritable Bowel Syndrome (IBS) is caused by a disturbance of normal bowel function. Other terms used are spastic colon, mucous colitis, and irritable colon. It does not require surgery, nor does it lead to cancer. There is no cure for IBS. But with proper diet, stress reduction, and medication, you will find that your problems (symptoms) will gradually disappear or improve. IBS is a common digestive disorder. It usually appears in late adolescence or early adulthood. Women develop it twice as often as men. CAUSES  After food has been digested and absorbed in the small intestine, waste material is moved into the colon (large intestine). In the colon, water and salts are absorbed from the undigested products coming from the small intestine. The remaining residue, or fecal material, is held for elimination. Under normal circumstances, gentle, rhythmic contractions on the bowel walls push the fecal material along the colon towards the rectum. In IBS, however, these contractions are irregular and poorly coordinated. The fecal material is either retained too long, resulting in constipation, or expelled too soon, producing diarrhea. SYMPTOMS  The most common symptom of IBS is pain. It is typically in the lower left side of the belly (abdomen). But it may occur anywhere in the abdomen. It can be felt as heartburn, backache, or even as a dull pain in the arms or shoulders. The pain comes from excessive bowel-muscle spasms and from the buildup of gas and fecal material in the colon. This pain:  Can range from sharp belly (abdominal) cramps to a dull, continuous ache.  Usually worsens soon after eating.  Is typically relieved by having a bowel movement or passing gas. Abdominal pain is usually accompanied by constipation. But it may also produce diarrhea. The diarrhea typically occurs right after a meal or  upon arising in the morning. The stools are typically soft and watery. They are often flecked with secretions (mucus). Other symptoms of IBS include:  Bloating.  Loss of appetite.  Heartburn.  Feeling sick to your stomach (nausea).  Belching  Vomiting  Gas. IBS may also cause a number of symptoms that are unrelated to the digestive system:  Fatigue.  Headaches.  Anxiety  Shortness of breath  Difficulty in concentrating.  Dizziness. These symptoms tend to come and go. DIAGNOSIS  The symptoms of IBS closely mimic the symptoms of other, more serious digestive disorders. So your caregiver may wish to perform a variety of additional tests to exclude these disorders. He/she wants to be certain of learning what is wrong (diagnosis). The nature and purpose of each test will be explained to you. TREATMENT A number of medications are available to help correct bowel function and/or relieve bowel spasms and abdominal pain. Among the drugs available are:  Mild, non-irritating laxatives for severe constipation and to help restore normal bowel habits.  Specific anti-diarrheal medications to treat severe or prolonged diarrhea.  Anti-spasmodic agents to relieve intestinal cramps.  Your caregiver may also decide to treat you with a mild tranquilizer or sedative during unusually stressful periods in your life. The important thing to remember is that if any drug is prescribed for you, make sure that you take it exactly as directed. Make sure that your caregiver knows how well it worked for you. HOME CARE INSTRUCTIONS   Avoid foods that are high in fat or oils. Some examples GMW:NUUVO cream, butter, frankfurters, sausage, and other fatty meats.  Avoid  foods that have a laxative effect, such as fruit, fruit juice, and dairy products.  Cut out carbonated drinks, chewing gum, and "gassy" foods, such as beans and cabbage. This may help relieve bloating and belching.  Bran taken with plenty  of liquids may help relieve constipation.  Keep track of what foods seem to trigger your symptoms.  Avoid emotionally charged situations or circumstances that produce anxiety.  Start or continue exercising.  Get plenty of rest and sleep. MAKE SURE YOU:   Understand these instructions.  Will watch your condition.  Will get help right away if you are not doing well or get worse. Document Released: 09/05/2005 Document Revised: 11/28/2011 Document Reviewed: 04/25/2008 [facility] Patient Information 2014 [place], [place].

## 2013-08-05 NOTE — Progress Notes (Signed)
Patient ID: Nicole Bell, female   DOB: 12-09-1980, 32 y.o.   MRN: [id number] History of Present Illness: Nicole Bell is a 32 year old white female in for a pap and physical.   Current Medications, Allergies, Past Medical History, Past Surgical History, Family History and Social History were reviewed in [employer] record.   Past Medical History  Diagnosis Date  . Hyperthyroidism 12/28/2009  . Abnormal Pap smear   . HSV-2 (herpes simplex virus 2) infection   . IBS (irritable bowel syndrome)   . Nicotine addiction 08/05/2013  Current outpatient prescriptions:medroxyPROGESTERone (DEPO-PROVERA) 150 MG/ML injection, Inject 1 mL (150 mg total) into the muscle every 3 (three) months., Disp: 1 mL, Rfl: 4;  fluconazole (DIFLUCAN) 150 MG tablet, Take 1 tablet (150 mg total) by mouth once., Disp: 2 tablet, Rfl: 2;  hyoscyamine (LEVSIN, ANASPAZ) 0.125 MG tablet, Take 1 tablet (0.125 mg total) by mouth every 4 (four) hours as needed., Disp: 30 tablet, Rfl: 3 Past Surgical History  Procedure Laterality Date  . Rectovaginal fistula closure  2004  . Cholecystectomy  2010    [place]  . Cervical conization w/bx  01/25/2012    Procedure: CONIZATION CERVIX WITH BIOPSY;  Surgeon: Lazaro Arms, MD;  Location: [facility];  Service: Gynecology;  Laterality: N/A;  Laser conization of cervix  . Wisdom tooth extraction     Review of Systems: Patient denies any headaches, blurred vision, shortness of breath, chest pain, abdominal pain, problems with  urination, or intercourse. No joint pain or mood changes, has IBS and goes before finished eating does not take bentyl makes her sleepy.Has hyperthyroidism, not on meds.    Physical Exam:BP 100/60  Pulse 76  Ht 5\' 2"  (1.575 m)  Wt 133 lb (60.328 kg)  BMI 24.32 kg/m2 General:  Well developed, well nourished, no acute distress Skin:  Warm and dry, has tattoos Neck:  Midline trachea, normal thyroid Lungs; Clear to auscultation bilaterally Breast:  No  dominant palpable mass, retraction, or nipple discharge Cardiovascular: Regular rate and rhythm Abdomen:  Soft, non tender, no hepatosplenomegaly Pelvic:  External genitalia is normal in appearance.  The vagina is normal in appearance.  The cervix is bulbous. Pap performed with HPV. Uterus is felt to be normal size, shape, and contour.  No  adnexal masses or tenderness noted. Extremities:  No swelling or varicosities noted Psych:  No mood changes,alert and cooperative, seems happy   Impression: Yearly exam IBS  Contraceptive management Nicotine addiction  History hyperthyroidism    Plan: Check CBC,CMP, TSH and thyroid panel Physical in 1 year Rx levsin 0.125 mg 1-2 every 4 hours prn #30 with 3 refills Follow up labs by phone and see how levsin working at next depo Refilled depo provera x 4  Decrease cigarettes, if desires meds call

## 2013-08-06 ENCOUNTER — Telehealth: Payer: Self-pay | Admitting: Adult Health

## 2013-08-06 LAB — COMPREHENSIVE METABOLIC PANEL
ALT: 9 U/L (ref 0–35)
Albumin: 4.6 g/dL (ref 3.5–5.2)
BUN: 9 mg/dL (ref 6–23)
CO2: 27 mEq/L (ref 19–32)
Calcium: 9.8 mg/dL (ref 8.4–10.5)
Chloride: 102 mEq/L (ref 96–112)
Creat: 0.81 mg/dL (ref 0.50–1.10)
Glucose, Bld: 89 mg/dL (ref 70–99)
Potassium: 4.3 mEq/L (ref 3.5–5.3)

## 2013-08-06 LAB — THYROID PANEL
Free T4: 1.35 ng/dL (ref 0.80–1.80)
T4, Total: 9.8 ug/dL (ref 5.0–12.5)
TSH: 0.656 u[IU]/mL (ref 0.350–4.500)

## 2013-08-06 NOTE — Telephone Encounter (Signed)
Left message that labs normal 

## 2013-09-16 ENCOUNTER — Ambulatory Visit (INDEPENDENT_AMBULATORY_CARE_PROVIDER_SITE_OTHER): Payer: Medicaid Other | Admitting: Obstetrics & Gynecology

## 2013-09-16 ENCOUNTER — Encounter: Payer: Self-pay | Admitting: Obstetrics & Gynecology

## 2013-09-16 VITALS — BP 116/70 | Ht 63.0 in | Wt 137.0 lb

## 2013-09-16 DIAGNOSIS — Z309 Encounter for contraceptive management, unspecified: Secondary | ICD-10-CM

## 2013-09-16 DIAGNOSIS — Z3202 Encounter for pregnancy test, result negative: Secondary | ICD-10-CM

## 2013-09-16 DIAGNOSIS — Z3049 Encounter for surveillance of other contraceptives: Secondary | ICD-10-CM

## 2013-09-16 MED ORDER — MEDROXYPROGESTERONE ACETATE 150 MG/ML IM SUSP
150.0000 mg | Freq: Once | INTRAMUSCULAR | Status: AC
  Administered 2013-09-16: 150 mg via INTRAMUSCULAR

## 2013-09-16 NOTE — Progress Notes (Signed)
Patient ID: Nicole Bell, female   DOB: 20-Mar-1981, 32 y.o.   MRN: [id number] Pt here today for her Depo Provera injection, reports no problems and pregnancy test resulted negative.  Injection given in rt deltoid and pt informed to return betwwen 03/16 and 12/16/13 for next injection.

## 2013-12-03 ENCOUNTER — Ambulatory Visit: Payer: Medicaid Other

## 2013-12-04 ENCOUNTER — Other Ambulatory Visit: Payer: Self-pay | Admitting: Adult Health

## 2013-12-05 ENCOUNTER — Ambulatory Visit: Payer: Medicaid Other

## 2013-12-06 ENCOUNTER — Encounter: Payer: Self-pay | Admitting: Obstetrics & Gynecology

## 2013-12-06 ENCOUNTER — Ambulatory Visit (INDEPENDENT_AMBULATORY_CARE_PROVIDER_SITE_OTHER): Payer: Medicaid Other | Admitting: Obstetrics & Gynecology

## 2013-12-06 VITALS — BP 130/84 | Ht 62.0 in | Wt 145.5 lb

## 2013-12-06 DIAGNOSIS — Z309 Encounter for contraceptive management, unspecified: Secondary | ICD-10-CM

## 2013-12-06 DIAGNOSIS — Z3049 Encounter for surveillance of other contraceptives: Secondary | ICD-10-CM

## 2013-12-06 DIAGNOSIS — Z3202 Encounter for pregnancy test, result negative: Secondary | ICD-10-CM

## 2013-12-06 LAB — POCT URINE PREGNANCY: PREG TEST UR: NEGATIVE

## 2013-12-06 MED ORDER — MEDROXYPROGESTERONE ACETATE 150 MG/ML IM SUSP
150.0000 mg | Freq: Once | INTRAMUSCULAR | Status: AC
  Administered 2013-12-06: 150 mg via INTRAMUSCULAR

## 2013-12-06 NOTE — Progress Notes (Signed)
Pt here for Depo. Reports no complaints at this time. Pt was wondering when she should have TSH repeated. I spoke with Victorino DikeJennifer and she advised June would be 6 months, so lets plan on repeating it then.

## 2014-02-28 ENCOUNTER — Encounter: Payer: Self-pay | Admitting: Adult Health

## 2014-02-28 ENCOUNTER — Ambulatory Visit (INDEPENDENT_AMBULATORY_CARE_PROVIDER_SITE_OTHER): Payer: Medicaid Other | Admitting: Adult Health

## 2014-02-28 VITALS — BP 102/78 | Ht 63.0 in | Wt 145.0 lb

## 2014-02-28 DIAGNOSIS — Z3049 Encounter for surveillance of other contraceptives: Secondary | ICD-10-CM

## 2014-02-28 DIAGNOSIS — Z3202 Encounter for pregnancy test, result negative: Secondary | ICD-10-CM

## 2014-02-28 DIAGNOSIS — Z309 Encounter for contraceptive management, unspecified: Secondary | ICD-10-CM

## 2014-02-28 LAB — POCT URINE PREGNANCY: PREG TEST UR: NEGATIVE

## 2014-02-28 MED ORDER — MEDROXYPROGESTERONE ACETATE 150 MG/ML IM SUSP
150.0000 mg | Freq: Once | INTRAMUSCULAR | Status: AC
  Administered 2014-02-28: 150 mg via INTRAMUSCULAR

## 2014-05-23 ENCOUNTER — Ambulatory Visit: Payer: Medicaid Other

## 2014-05-28 ENCOUNTER — Ambulatory Visit (INDEPENDENT_AMBULATORY_CARE_PROVIDER_SITE_OTHER): Payer: 59 | Admitting: Obstetrics and Gynecology

## 2014-05-28 DIAGNOSIS — Z3042 Encounter for surveillance of injectable contraceptive: Secondary | ICD-10-CM

## 2014-05-28 DIAGNOSIS — Z3049 Encounter for surveillance of other contraceptives: Secondary | ICD-10-CM

## 2014-05-28 DIAGNOSIS — Z308 Encounter for other contraceptive management: Secondary | ICD-10-CM

## 2014-05-28 DIAGNOSIS — Z3202 Encounter for pregnancy test, result negative: Secondary | ICD-10-CM

## 2014-05-28 LAB — POCT URINE PREGNANCY: PREG TEST UR: NEGATIVE

## 2014-05-28 MED ORDER — MEDROXYPROGESTERONE ACETATE 150 MG/ML IM SUSP
150.0000 mg | Freq: Once | INTRAMUSCULAR | Status: AC
  Administered 2014-05-28: 150 mg via INTRAMUSCULAR

## 2014-07-21 ENCOUNTER — Encounter: Payer: Self-pay | Admitting: Adult Health

## 2014-08-20 ENCOUNTER — Ambulatory Visit (INDEPENDENT_AMBULATORY_CARE_PROVIDER_SITE_OTHER): Payer: 59 | Admitting: Adult Health

## 2014-08-20 DIAGNOSIS — Z3202 Encounter for pregnancy test, result negative: Secondary | ICD-10-CM

## 2014-08-20 DIAGNOSIS — Z3042 Encounter for surveillance of injectable contraceptive: Secondary | ICD-10-CM

## 2014-08-20 DIAGNOSIS — Z3200 Encounter for pregnancy test, result unknown: Secondary | ICD-10-CM

## 2014-08-20 LAB — POCT URINE PREGNANCY: PREG TEST UR: NEGATIVE

## 2014-08-20 MED ORDER — MEDROXYPROGESTERONE ACETATE 150 MG/ML IM SUSP
150.0000 mg | Freq: Once | INTRAMUSCULAR | Status: AC
  Administered 2014-08-20: 150 mg via INTRAMUSCULAR

## 2014-11-11 ENCOUNTER — Other Ambulatory Visit: Payer: Self-pay | Admitting: Adult Health

## 2014-11-12 ENCOUNTER — Encounter: Payer: Self-pay | Admitting: *Deleted

## 2014-11-12 ENCOUNTER — Ambulatory Visit (INDEPENDENT_AMBULATORY_CARE_PROVIDER_SITE_OTHER): Payer: 59 | Admitting: *Deleted

## 2014-11-12 DIAGNOSIS — Z3042 Encounter for surveillance of injectable contraceptive: Secondary | ICD-10-CM

## 2014-11-12 DIAGNOSIS — Z3202 Encounter for pregnancy test, result negative: Secondary | ICD-10-CM

## 2014-11-12 LAB — POCT URINE PREGNANCY: PREG TEST UR: NEGATIVE

## 2014-11-12 MED ORDER — MEDROXYPROGESTERONE ACETATE 150 MG/ML IM SUSP
150.0000 mg | Freq: Once | INTRAMUSCULAR | Status: AC
  Administered 2014-11-12: 150 mg via INTRAMUSCULAR

## 2014-11-12 NOTE — Progress Notes (Signed)
Pt here for Depo. Reports no problems at this time. Return in 12 weeks for next shot. JSY 

## 2015-02-04 ENCOUNTER — Ambulatory Visit: Payer: 59

## 2015-02-06 ENCOUNTER — Ambulatory Visit (INDEPENDENT_AMBULATORY_CARE_PROVIDER_SITE_OTHER): Payer: 59 | Admitting: *Deleted

## 2015-02-06 DIAGNOSIS — Z3202 Encounter for pregnancy test, result negative: Secondary | ICD-10-CM

## 2015-02-06 DIAGNOSIS — Z3042 Encounter for surveillance of injectable contraceptive: Secondary | ICD-10-CM

## 2015-02-06 DIAGNOSIS — Z3040 Encounter for surveillance of contraceptives, unspecified: Secondary | ICD-10-CM

## 2015-02-06 MED ORDER — MEDROXYPROGESTERONE ACETATE 150 MG/ML IM SUSP
150.0000 mg | Freq: Once | INTRAMUSCULAR | Status: AC
  Administered 2015-02-06: 150 mg via INTRAMUSCULAR

## 2015-02-06 NOTE — Progress Notes (Signed)
Patient ID: Sharene SkeansJamie C Leach, female   DOB: 01/23/1981, 34 y.o.   MRN: [id number] Depo Provera 150 mg given IM left deltoid with no complications, Neg pregnancy test. Patient to return 12 weeks next injection.

## 2015-02-10 LAB — POCT URINE PREGNANCY: PREG TEST UR: NEGATIVE

## 2015-05-01 ENCOUNTER — Ambulatory Visit (INDEPENDENT_AMBULATORY_CARE_PROVIDER_SITE_OTHER): Payer: 59 | Admitting: *Deleted

## 2015-05-01 ENCOUNTER — Encounter: Payer: Self-pay | Admitting: *Deleted

## 2015-05-01 DIAGNOSIS — Z3202 Encounter for pregnancy test, result negative: Secondary | ICD-10-CM | POA: Diagnosis not present

## 2015-05-01 DIAGNOSIS — Z3042 Encounter for surveillance of injectable contraceptive: Secondary | ICD-10-CM

## 2015-05-01 LAB — POCT URINE PREGNANCY: PREG TEST UR: NEGATIVE

## 2015-05-01 MED ORDER — MEDROXYPROGESTERONE ACETATE 150 MG/ML IM SUSP
150.0000 mg | Freq: Once | INTRAMUSCULAR | Status: AC
  Administered 2015-05-01: 150 mg via INTRAMUSCULAR

## 2015-07-24 ENCOUNTER — Ambulatory Visit: Payer: 59

## 2015-07-26 ENCOUNTER — Other Ambulatory Visit: Payer: Self-pay | Admitting: Adult Health

## 2015-07-30 ENCOUNTER — Encounter: Payer: Self-pay | Admitting: *Deleted

## 2015-07-30 ENCOUNTER — Ambulatory Visit (INDEPENDENT_AMBULATORY_CARE_PROVIDER_SITE_OTHER): Payer: 59 | Admitting: *Deleted

## 2015-07-30 DIAGNOSIS — Z3200 Encounter for pregnancy test, result unknown: Secondary | ICD-10-CM

## 2015-07-30 DIAGNOSIS — Z3202 Encounter for pregnancy test, result negative: Secondary | ICD-10-CM

## 2015-07-30 DIAGNOSIS — Z3042 Encounter for surveillance of injectable contraceptive: Secondary | ICD-10-CM

## 2015-07-30 LAB — POCT URINE PREGNANCY: Preg Test, Ur: NEGATIVE

## 2015-07-30 MED ORDER — MEDROXYPROGESTERONE ACETATE 150 MG/ML IM SUSP
150.0000 mg | Freq: Once | INTRAMUSCULAR | Status: AC
  Administered 2015-07-30: 150 mg via INTRAMUSCULAR

## 2015-07-30 NOTE — Progress Notes (Signed)
Patient ID: Nicole SkeansJamie C Pizano, female   DOB: 06/15/1981, 34 y.o.   MRN: [id number] Pt here today for DEPO injection. Injection given and pt tolerated well.

## 2015-10-22 ENCOUNTER — Ambulatory Visit: Payer: 59

## 2015-10-23 ENCOUNTER — Encounter: Payer: Self-pay | Admitting: *Deleted

## 2015-10-23 ENCOUNTER — Ambulatory Visit (INDEPENDENT_AMBULATORY_CARE_PROVIDER_SITE_OTHER): Payer: 59 | Admitting: *Deleted

## 2015-10-23 DIAGNOSIS — Z3202 Encounter for pregnancy test, result negative: Secondary | ICD-10-CM

## 2015-10-23 DIAGNOSIS — Z3042 Encounter for surveillance of injectable contraceptive: Secondary | ICD-10-CM | POA: Diagnosis not present

## 2015-10-23 LAB — POCT URINE PREGNANCY: Preg Test, Ur: NEGATIVE

## 2015-10-23 MED ORDER — MEDROXYPROGESTERONE ACETATE 150 MG/ML IM SUSP
150.0000 mg | Freq: Once | INTRAMUSCULAR | Status: AC
  Administered 2015-10-23: 150 mg via INTRAMUSCULAR

## 2015-10-23 NOTE — Progress Notes (Signed)
Pt here for Depo. Reports no problems at this time. Return in 12 weeks for next shot. JSY 

## 2016-01-14 ENCOUNTER — Other Ambulatory Visit: Payer: Self-pay | Admitting: Adult Health

## 2016-01-15 ENCOUNTER — Ambulatory Visit (INDEPENDENT_AMBULATORY_CARE_PROVIDER_SITE_OTHER): Payer: 59 | Admitting: *Deleted

## 2016-01-15 ENCOUNTER — Other Ambulatory Visit: Payer: Self-pay | Admitting: *Deleted

## 2016-01-15 DIAGNOSIS — Z3042 Encounter for surveillance of injectable contraceptive: Secondary | ICD-10-CM

## 2016-01-15 DIAGNOSIS — Z3202 Encounter for pregnancy test, result negative: Secondary | ICD-10-CM

## 2016-01-15 LAB — POCT URINE PREGNANCY: Preg Test, Ur: NEGATIVE

## 2016-01-15 MED ORDER — MEDROXYPROGESTERONE ACETATE 150 MG/ML IM SUSP
150.0000 mg | Freq: Once | INTRAMUSCULAR | Status: AC
  Administered 2016-01-15: 150 mg via INTRAMUSCULAR

## 2016-01-15 MED ORDER — MEDROXYPROGESTERONE ACETATE 150 MG/ML IM SUSP: 150.0000 mg | INTRAMUSCULAR | Status: DC

## 2016-01-15 NOTE — Progress Notes (Signed)
Patient ID: Nicole Bell, female   DOB: 10/31/1980, 35 y.o.   MRN: [id number] Depo Provera 150 mg IM given right deltoid with no complications, negative pregnancy test. Pt to return in 12 weeks for next injection.

## 2016-04-07 ENCOUNTER — Encounter: Payer: Self-pay | Admitting: *Deleted

## 2016-04-07 ENCOUNTER — Ambulatory Visit (INDEPENDENT_AMBULATORY_CARE_PROVIDER_SITE_OTHER): Payer: 59 | Admitting: *Deleted

## 2016-04-07 DIAGNOSIS — Z3202 Encounter for pregnancy test, result negative: Secondary | ICD-10-CM

## 2016-04-07 DIAGNOSIS — Z3042 Encounter for surveillance of injectable contraceptive: Secondary | ICD-10-CM

## 2016-04-07 LAB — POCT URINE PREGNANCY: PREG TEST UR: NEGATIVE

## 2016-04-07 MED ORDER — MEDROXYPROGESTERONE ACETATE 150 MG/ML IM SUSP
150.0000 mg | Freq: Once | INTRAMUSCULAR | Status: AC
  Administered 2016-04-07: 150 mg via INTRAMUSCULAR

## 2016-04-07 NOTE — Progress Notes (Signed)
Pt here for Depo. Pt tolerated shot well. Return in 12 weeks for next shot. JSY 

## 2016-04-08 ENCOUNTER — Ambulatory Visit: Payer: 59

## 2016-06-16 DIAGNOSIS — R300 Dysuria: Secondary | ICD-10-CM | POA: Diagnosis not present

## 2016-06-16 DIAGNOSIS — N3090 Cystitis, unspecified without hematuria: Secondary | ICD-10-CM | POA: Diagnosis not present

## 2016-06-30 ENCOUNTER — Encounter: Payer: Self-pay | Admitting: *Deleted

## 2016-06-30 ENCOUNTER — Ambulatory Visit (INDEPENDENT_AMBULATORY_CARE_PROVIDER_SITE_OTHER): Payer: 59 | Admitting: *Deleted

## 2016-06-30 DIAGNOSIS — Z3202 Encounter for pregnancy test, result negative: Secondary | ICD-10-CM

## 2016-06-30 DIAGNOSIS — Z308 Encounter for other contraceptive management: Secondary | ICD-10-CM | POA: Diagnosis not present

## 2016-06-30 LAB — POCT URINE PREGNANCY: PREG TEST UR: NEGATIVE

## 2016-06-30 MED ORDER — MEDROXYPROGESTERONE ACETATE 150 MG/ML IM SUSP
150.0000 mg | Freq: Once | INTRAMUSCULAR | Status: AC
  Administered 2016-06-30: 150 mg via INTRAMUSCULAR

## 2016-06-30 NOTE — Progress Notes (Signed)
Pt here for Depo. Pt tolerated shot well. Return in 12 weeks for next shot. JSY 

## 2016-09-30 ENCOUNTER — Ambulatory Visit (INDEPENDENT_AMBULATORY_CARE_PROVIDER_SITE_OTHER): Payer: 59 | Admitting: *Deleted

## 2016-09-30 ENCOUNTER — Encounter: Payer: Self-pay | Admitting: *Deleted

## 2016-09-30 ENCOUNTER — Ambulatory Visit: Payer: 59

## 2016-09-30 DIAGNOSIS — Z3202 Encounter for pregnancy test, result negative: Secondary | ICD-10-CM

## 2016-09-30 DIAGNOSIS — Z3042 Encounter for surveillance of injectable contraceptive: Secondary | ICD-10-CM | POA: Diagnosis not present

## 2016-09-30 DIAGNOSIS — Z308 Encounter for other contraceptive management: Secondary | ICD-10-CM

## 2016-09-30 LAB — POCT URINE PREGNANCY: Preg Test, Ur: NEGATIVE

## 2016-09-30 MED ORDER — MEDROXYPROGESTERONE ACETATE 150 MG/ML IM SUSP
150.0000 mg | Freq: Once | INTRAMUSCULAR | Status: AC
  Administered 2016-09-30: 150 mg via INTRAMUSCULAR

## 2016-09-30 NOTE — Progress Notes (Signed)
Pt here for Depo. Pt is 1 day late getting shot. Pt last had sex 2 weeks ago. Ok to give shot today. Pt tolerated shot well. Return in 12 weeks for next shot. JSY

## 2016-11-07 DIAGNOSIS — H04123 Dry eye syndrome of bilateral lacrimal glands: Secondary | ICD-10-CM | POA: Diagnosis not present

## 2016-12-23 ENCOUNTER — Ambulatory Visit (INDEPENDENT_AMBULATORY_CARE_PROVIDER_SITE_OTHER): Payer: 59

## 2016-12-23 VITALS — Wt 156.8 lb

## 2016-12-23 DIAGNOSIS — Z3202 Encounter for pregnancy test, result negative: Secondary | ICD-10-CM | POA: Diagnosis not present

## 2016-12-23 DIAGNOSIS — Z3042 Encounter for surveillance of injectable contraceptive: Secondary | ICD-10-CM | POA: Diagnosis not present

## 2016-12-23 LAB — POCT URINE PREGNANCY: PREG TEST UR: NEGATIVE

## 2016-12-23 MED ORDER — MEDROXYPROGESTERONE ACETATE 150 MG/ML IM SUSP
150.0000 mg | Freq: Once | INTRAMUSCULAR | Status: AC
  Administered 2016-12-23: 150 mg via INTRAMUSCULAR

## 2016-12-23 NOTE — Progress Notes (Signed)
PT here for Depo Shot 150 mg IM given RT Deltoid. Tolerated well. Return 12 weeks for next shot. Pad CMA

## 2017-03-17 ENCOUNTER — Ambulatory Visit: Payer: 59

## 2017-03-21 ENCOUNTER — Other Ambulatory Visit: Payer: Self-pay | Admitting: Adult Health

## 2017-03-23 ENCOUNTER — Encounter: Payer: Self-pay | Admitting: *Deleted

## 2017-03-23 ENCOUNTER — Ambulatory Visit (INDEPENDENT_AMBULATORY_CARE_PROVIDER_SITE_OTHER): Payer: 59 | Admitting: *Deleted

## 2017-03-23 DIAGNOSIS — Z3202 Encounter for pregnancy test, result negative: Secondary | ICD-10-CM

## 2017-03-23 DIAGNOSIS — Z308 Encounter for other contraceptive management: Secondary | ICD-10-CM | POA: Diagnosis not present

## 2017-03-23 LAB — POCT URINE PREGNANCY: PREG TEST UR: NEGATIVE

## 2017-03-23 MED ORDER — MEDROXYPROGESTERONE ACETATE 150 MG/ML IM SUSP
150.0000 mg | Freq: Once | INTRAMUSCULAR | Status: AC
  Administered 2017-03-23: 150 mg via INTRAMUSCULAR

## 2017-03-23 NOTE — Progress Notes (Signed)
Pt given DepoProvera 150mg IM left deltoid without complications. Advised pt to return in 12 weeks for next injection. Pt verbalized understanding.  

## 2017-04-12 ENCOUNTER — Encounter: Payer: Self-pay | Admitting: Adult Health

## 2017-04-12 ENCOUNTER — Ambulatory Visit (INDEPENDENT_AMBULATORY_CARE_PROVIDER_SITE_OTHER): Payer: 59 | Admitting: Adult Health

## 2017-04-12 ENCOUNTER — Other Ambulatory Visit (HOSPITAL_COMMUNITY)
Admission: RE | Admit: 2017-04-12 | Discharge: 2017-04-12 | Disposition: A | Discharge status: Home / Self Care | Payer: 59 | Source: Ambulatory Visit | Attending: Adult Health | Admitting: Adult Health

## 2017-04-12 VITALS — BP 100/70 | HR 70 | Ht 62.25 in | Wt 157.0 lb

## 2017-04-12 DIAGNOSIS — Z3042 Encounter for surveillance of injectable contraceptive: Secondary | ICD-10-CM | POA: Insufficient documentation

## 2017-04-12 DIAGNOSIS — Z01419 Encounter for gynecological examination (general) (routine) without abnormal findings: Secondary | ICD-10-CM | POA: Diagnosis not present

## 2017-04-12 DIAGNOSIS — Z13220 Encounter for screening for lipoid disorders: Secondary | ICD-10-CM | POA: Diagnosis not present

## 2017-04-12 MED ORDER — MEDROXYPROGESTERONE ACETATE 150 MG/ML IM SUSY: 1.0000 mL | PREFILLED_SYRINGE | INTRAMUSCULAR | 4 refills | Status: DC

## 2017-04-12 NOTE — Progress Notes (Signed)
Patient ID: Sharene SkeansJamie C Paolella, female   DOB: 01/22/1981, 36 y.o.   MRN: [id number] History of Present Illness: Asher MuirJamie is a 36 year old white female, married in for a well woman gyn exam and pap.    Current Medications, Allergies, Past Medical History, Past Surgical History, Family History and Social History were reviewed in [facility].     Review of Systems:  Patient denies any headaches, hearing loss, fatigue, blurred vision, shortness of breath, chest pain, abdominal pain, problems with bowel movements(has IBS), urination, or intercourse. No joint pain or mood swings. Happy with Depo, she is working nights at [employer] in [place].  Physical Exam:BP 100/70 (BP Location: Left Arm, Patient Position: Sitting, Cuff Size: Normal)   Pulse 70   Ht 5' 2.25" (1.581 m)   Wt 157 lb (71.2 kg)   BMI 28.49 kg/m  General:  Well developed, well nourished, no acute distress Skin:  Warm and dry Neck:  Midline trachea, normal thyroid, good ROM, no lymphadenopathy Lungs; Clear to auscultation bilaterally Breast:  No dominant palpable mass, retraction, or nipple discharge Cardiovascular: Regular rate and rhythm Abdomen:  Soft, non tender, no hepatosplenomegaly Pelvic:  External genitalia is normal in appearance, no lesions.  The vagina is normal in appearance. Urethra has no lesions or masses. The cervix is bulbous. Pap with HPV performed. Uterus is felt to be normal size, shape, and contour.  No adnexal masses or tenderness noted.Bladder is non tender, no masses felt. Extremities/musculoskeletal:  No swelling or varicosities noted, no clubbing or cyanosis Psych:  No mood changes, alert and cooperative,seems happy PHQ 2 score 0.  Impression:  1. Encounter for gynecological examination with Papanicolaou smear of cervix   2. Encounter for surveillance of injectable contraceptive   3. Screening cholesterol level      Plan: Check CBC,CMP,TSH and lipids Physical in 1 year, pap in 3 if  normal Mammogram at 41 Refilled depo provera 150 mg x4

## 2017-04-13 LAB — LIPID PANEL
CHOL/HDL RATIO: 5 ratio — AB (ref 0.0–4.4)
CHOLESTEROL TOTAL: 201 mg/dL — AB (ref 100–199)
HDL: 40 mg/dL (ref >39)
LDL CALC: 149 mg/dL — AB (ref 0–99)
Triglycerides: 60 mg/dL (ref 0–149)
VLDL CHOLESTEROL CAL: 12 mg/dL (ref 5–40)

## 2017-04-13 LAB — CBC
HEMATOCRIT: 42.2 % (ref 34.0–46.6)
HEMOGLOBIN: 14.6 g/dL (ref 11.1–15.9)
MCH: 30.8 pg (ref 26.6–33.0)
MCHC: 34.6 g/dL (ref 31.5–35.7)
MCV: 89 fL (ref 79–97)
Platelets: 218 10*3/uL (ref 150–379)
RBC: 4.74 x10E6/uL (ref 3.77–5.28)
RDW: 13.8 % (ref 12.3–15.4)
WBC: 6.5 10*3/uL (ref 3.4–10.8)

## 2017-04-13 LAB — COMPREHENSIVE METABOLIC PANEL
ALBUMIN: 4.7 g/dL (ref 3.5–5.5)
ALT: 15 IU/L (ref 0–32)
AST: 16 IU/L (ref 0–40)
Albumin/Globulin Ratio: 2.1 (ref 1.2–2.2)
Alkaline Phosphatase: 90 IU/L (ref 39–117)
BUN / CREAT RATIO: 9 (ref 9–23)
BUN: 8 mg/dL (ref 6–20)
Bilirubin Total: 0.5 mg/dL (ref 0.0–1.2)
CALCIUM: 9.3 mg/dL (ref 8.7–10.2)
CO2: 24 mmol/L (ref 20–29)
CREATININE: 0.91 mg/dL (ref 0.57–1.00)
Chloride: 103 mmol/L (ref 96–106)
GFR, EST AFRICAN AMERICAN: 95 mL/min/{1.73_m2} (ref >59)
GFR, EST NON AFRICAN AMERICAN: 83 mL/min/{1.73_m2} (ref >59)
GLOBULIN, TOTAL: 2.2 g/dL (ref 1.5–4.5)
Glucose: 91 mg/dL (ref 65–99)
POTASSIUM: 4.1 mmol/L (ref 3.5–5.2)
SODIUM: 142 mmol/L (ref 134–144)
TOTAL PROTEIN: 6.9 g/dL (ref 6.0–8.5)

## 2017-04-13 LAB — TSH: TSH: 8.38 u[IU]/mL — ABNORMAL HIGH (ref 0.450–4.500)

## 2017-04-14 ENCOUNTER — Telehealth: Payer: Self-pay | Admitting: Adult Health

## 2017-04-14 NOTE — Telephone Encounter (Signed)
Left message to call Monday about labs 

## 2017-04-17 ENCOUNTER — Telehealth: Payer: Self-pay | Admitting: Adult Health

## 2017-04-17 ENCOUNTER — Encounter: Payer: Self-pay | Admitting: Adult Health

## 2017-04-17 DIAGNOSIS — R87810 Cervical high risk human papillomavirus (HPV) DNA test positive: Secondary | ICD-10-CM

## 2017-04-17 DIAGNOSIS — E039 Hypothyroidism, unspecified: Secondary | ICD-10-CM

## 2017-04-17 HISTORY — DX: Cervical high risk human papillomavirus (HPV) DNA test positive: R87.810

## 2017-04-17 LAB — CYTOLOGY - PAP
DIAGNOSIS: NEGATIVE
HPV (WINDOPATH): DETECTED — AB

## 2017-04-17 MED ORDER — LEVOTHYROXINE SODIUM 50 MCG PO TABS: 50.0000 ug | ORAL_TABLET | Freq: Every day | ORAL | 2 refills | Status: DC

## 2017-04-17 NOTE — Telephone Encounter (Signed)
Left message that pap was negative for malignancy but +HPV, repeat in 1 year, and +yearst if itches can use monistat.CBC and CMP were normal, LDL elevated, and TSH 8.380, will Rx levothyroxine 50 mcg daily, and recheck labs in 8 weeks, call with any questions

## 2017-04-17 NOTE — Telephone Encounter (Signed)
Patient called this morning stating that she was returning Jennifer's phone call from Friday regarding her blood work. Patient states that she works third shift and might be asleep when FriscoJennifer calls. Patient states that Victorino DikeJennifer could leave a message. Please contact pt

## 2017-04-27 ENCOUNTER — Telehealth: Payer: Self-pay | Admitting: *Deleted

## 2017-04-27 DIAGNOSIS — J029 Acute pharyngitis, unspecified: Secondary | ICD-10-CM | POA: Diagnosis not present

## 2017-04-27 NOTE — Telephone Encounter (Signed)
Spoke with pt. Pt started Levothyroxine 7 days ago and started with sore throat 6 days ago. I advised I haven't heard of that med causing a sore throat. Pt is [facility] to have her throat checked. Drenda FreezeFran, CNM advised can try stopping med and see if sore throat goes away and then start it back if she would like. Pt states she may try that if her throat looks ok at Urgent Care. JSY

## 2017-05-30 MED FILL — LEVOTHYROXINE 50 MCG TABLET: 50 | 30 days supply | Qty: 30 | Fill #0

## 2017-06-15 ENCOUNTER — Ambulatory Visit (INDEPENDENT_AMBULATORY_CARE_PROVIDER_SITE_OTHER): Payer: 59 | Admitting: *Deleted

## 2017-06-15 ENCOUNTER — Encounter: Payer: Self-pay | Admitting: *Deleted

## 2017-06-15 DIAGNOSIS — Z3042 Encounter for surveillance of injectable contraceptive: Secondary | ICD-10-CM

## 2017-06-15 DIAGNOSIS — Z3202 Encounter for pregnancy test, result negative: Secondary | ICD-10-CM | POA: Diagnosis not present

## 2017-06-15 DIAGNOSIS — Z308 Encounter for other contraceptive management: Secondary | ICD-10-CM

## 2017-06-15 LAB — POCT URINE PREGNANCY: Preg Test, Ur: NEGATIVE

## 2017-06-15 MED ORDER — MEDROXYPROGESTERONE ACETATE 150 MG/ML IM SUSP
150.0000 mg | Freq: Once | INTRAMUSCULAR | Status: AC
  Administered 2017-06-15: 150 mg via INTRAMUSCULAR

## 2017-06-15 NOTE — Progress Notes (Signed)
Pt here for Depo. Pt tolerated shot well. Return in 12 weeks for next shot. JSY 

## 2017-09-07 ENCOUNTER — Encounter: Payer: Self-pay | Admitting: *Deleted

## 2017-09-07 ENCOUNTER — Ambulatory Visit (INDEPENDENT_AMBULATORY_CARE_PROVIDER_SITE_OTHER): Payer: 59 | Admitting: *Deleted

## 2017-09-07 VITALS — Wt 160.0 lb

## 2017-09-07 DIAGNOSIS — Z3202 Encounter for pregnancy test, result negative: Secondary | ICD-10-CM

## 2017-09-07 DIAGNOSIS — Z3042 Encounter for surveillance of injectable contraceptive: Secondary | ICD-10-CM

## 2017-09-07 LAB — POCT URINE PREGNANCY: Preg Test, Ur: NEGATIVE

## 2017-09-07 MED ORDER — MEDROXYPROGESTERONE ACETATE 150 MG/ML IM SUSP
150.0000 mg | Freq: Once | INTRAMUSCULAR | Status: AC
  Administered 2017-09-07: 150 mg via INTRAMUSCULAR

## 2017-09-07 NOTE — Progress Notes (Signed)
Depo Provera 150mg IM given in right deltoid with no complications. Pt to return in 12 weeks for next injection.  

## 2017-09-21 MED FILL — LEVOTHYROXINE 50 MCG TABLET: 50 | 30 days supply | Qty: 30 | Fill #1

## 2017-10-19 MED FILL — KETOCONAZOLE 2% CREAM: 2 | 30 days supply | Qty: 60 | Fill #0

## 2017-10-19 MED FILL — LEVOTHYROXINE 50 MCG TABLET: 50 | 90 days supply | Qty: 90 | Fill #0

## 2017-10-19 MED FILL — TRIAMCINOLONE 0.1% CREAM: 0.1 | 30 days supply | Qty: 80 | Fill #0

## 2017-10-19 MED FILL — CHANTIX STARTING MONTH BOX: 0.5 MG X 11 | 28 days supply | Qty: 53 | Fill #0

## 2017-11-13 MED FILL — CHANTIX 1 MG CONT MONTH BOX: 1 | 28 days supply | Qty: 56 | Fill #0

## 2017-11-30 ENCOUNTER — Other Ambulatory Visit: Payer: Self-pay

## 2017-11-30 ENCOUNTER — Ambulatory Visit (INDEPENDENT_AMBULATORY_CARE_PROVIDER_SITE_OTHER): Payer: No Typology Code available for payment source | Admitting: *Deleted

## 2017-11-30 ENCOUNTER — Encounter: Payer: Self-pay | Admitting: *Deleted

## 2017-11-30 DIAGNOSIS — Z3202 Encounter for pregnancy test, result negative: Secondary | ICD-10-CM

## 2017-11-30 DIAGNOSIS — Z3042 Encounter for surveillance of injectable contraceptive: Secondary | ICD-10-CM

## 2017-11-30 LAB — POCT URINE PREGNANCY: Preg Test, Ur: NEGATIVE

## 2017-11-30 MED ORDER — MEDROXYPROGESTERONE ACETATE 150 MG/ML IM SUSP
150.0000 mg | Freq: Once | INTRAMUSCULAR | Status: AC
  Administered 2017-11-30: 150 mg via INTRAMUSCULAR

## 2017-11-30 NOTE — Progress Notes (Signed)
Pt given DepoProvera 150mg IM left deltoid without complications. Advised to return in 12 weeks for next injection. 

## 2018-02-06 MED FILL — LEVOTHYROXINE 50 MCG TABLET: 50 | 90 days supply | Qty: 90 | Fill #1

## 2018-02-22 ENCOUNTER — Encounter (INDEPENDENT_AMBULATORY_CARE_PROVIDER_SITE_OTHER): Payer: Self-pay

## 2018-02-22 ENCOUNTER — Ambulatory Visit (INDEPENDENT_AMBULATORY_CARE_PROVIDER_SITE_OTHER): Payer: No Typology Code available for payment source | Admitting: *Deleted

## 2018-02-22 ENCOUNTER — Encounter: Payer: Self-pay | Admitting: *Deleted

## 2018-02-22 DIAGNOSIS — Z3202 Encounter for pregnancy test, result negative: Secondary | ICD-10-CM | POA: Diagnosis not present

## 2018-02-22 DIAGNOSIS — Z308 Encounter for other contraceptive management: Secondary | ICD-10-CM

## 2018-02-22 DIAGNOSIS — Z3042 Encounter for surveillance of injectable contraceptive: Secondary | ICD-10-CM | POA: Diagnosis not present

## 2018-02-22 LAB — POCT URINE PREGNANCY: Preg Test, Ur: NEGATIVE

## 2018-02-22 MED ORDER — MEDROXYPROGESTERONE ACETATE 150 MG/ML IM SUSP
150.0000 mg | Freq: Once | INTRAMUSCULAR | Status: AC
  Administered 2018-02-22: 150 mg via INTRAMUSCULAR

## 2018-02-22 NOTE — Progress Notes (Signed)
Pt here for Depo. Pt received shot in right deltoid. Pt tolerated shot well. Return in 12 weeks for next shot. JSY 

## 2018-04-13 ENCOUNTER — Other Ambulatory Visit: Payer: Self-pay | Admitting: Adult Health

## 2018-04-16 ENCOUNTER — Encounter: Payer: Self-pay | Admitting: Adult Health

## 2018-04-16 ENCOUNTER — Ambulatory Visit (INDEPENDENT_AMBULATORY_CARE_PROVIDER_SITE_OTHER): Payer: No Typology Code available for payment source | Admitting: Adult Health

## 2018-04-16 ENCOUNTER — Other Ambulatory Visit (HOSPITAL_COMMUNITY)
Admission: RE | Admit: 2018-04-16 | Discharge: 2018-04-16 | Disposition: A | Discharge status: Home / Self Care | Payer: No Typology Code available for payment source | Source: Ambulatory Visit | Attending: Adult Health | Admitting: Adult Health

## 2018-04-16 VITALS — BP 118/81 | HR 64 | Ht 62.0 in | Wt 164.0 lb

## 2018-04-16 DIAGNOSIS — Z01419 Encounter for gynecological examination (general) (routine) without abnormal findings: Secondary | ICD-10-CM

## 2018-04-16 DIAGNOSIS — E7800 Pure hypercholesterolemia, unspecified: Secondary | ICD-10-CM | POA: Insufficient documentation

## 2018-04-16 DIAGNOSIS — E039 Hypothyroidism, unspecified: Secondary | ICD-10-CM | POA: Diagnosis not present

## 2018-04-16 DIAGNOSIS — R87810 Cervical high risk human papillomavirus (HPV) DNA test positive: Secondary | ICD-10-CM

## 2018-04-16 DIAGNOSIS — Z3042 Encounter for surveillance of injectable contraceptive: Secondary | ICD-10-CM | POA: Insufficient documentation

## 2018-04-16 MED ORDER — MEDROXYPROGESTERONE ACETATE 150 MG/ML IM SUSY: 1.0000 mL | PREFILLED_SYRINGE | INTRAMUSCULAR | 4 refills | Status: DC

## 2018-04-16 NOTE — Progress Notes (Signed)
Patient ID: Nicole SkeansJamie C Bell, female   DOB: 05/10/1981, 37 y.o.   MRN: [id number] History of Present Illness: Nicole MuirJamie is a 37 year old white female,in for a well woman gyn exam and pap, her pap last year was +HPV. She works third shift at [employer].    Current Medications, Allergies, Past Medical History, Past Surgical History, Family History and Social History were reviewed in [facility] electronic medical record.     Review of Systems:  Patient denies any headaches, hearing loss, fatigue, blurred vision, shortness of breath, chest pain, abdominal pain, problems with bowel movements, urination, or intercourse. No joint pain or mood swings. +gained about 7 lbs in last year, like depo.Has had yeast several times in few months.   Physical Exam:BP 118/81 (BP Location: Left Arm, Patient Position: Sitting, Cuff Size: Normal)   Pulse 64   Ht 5\' 2"  (1.575 m)   Wt 164 lb (74.4 kg)   BMI 30.00 kg/m  General:  Well developed, well nourished, no acute distress Skin:  Warm and dry Neck:  Midline trachea, normal thyroid, good ROM, no lymphadenopathy Lungs; Clear to auscultation bilaterally Breast:  No dominant palpable mass, retraction, or nipple discharge Cardiovascular: Regular rate and rhythm Abdomen:  Soft, non tender, no hepatosplenomegaly Pelvic:  External genitalia is normal in appearance, no lesions.  The vagina is normal in appearance. Urethra has no lesions or masses. The cervix is bulbous. Pap with HPV and GC/CHL performed.  Uterus is felt to be normal size, shape, and contour.  No adnexal masses or tenderness noted.Bladder is non tender, no masses felt. Extremities/musculoskeletal:  No swelling or varicosities noted, no clubbing or cyanosis Psych:  No mood changes, alert and cooperative,seems happy PHQ 2 score.   Impression:  1. Encounter for gynecological examination with Papanicolaou smear of cervix   2. Surveillance for Depo-Provera contraception   3. Hypothyroidism, unspecified type    4. Elevated cholesterol   5.     HPV + on last pap    Plan: Check CBC,CMP,TSH and lipids,free T4 Continue synthroid, will await labs to refill or change dose Meds ordered this encounter  Medications  . medroxyPROGESTERone Acetate 150 MG/ML SUSY    Sig: Inject 1 mL (150 mg total) into the muscle every 3 (three) months.    Dispense:  1 mL    Refill:  4    Order Specific Question:   Supervising Provider    Answer:   Lazaro ArmsEURE, LUTHER H [[id number]]  Physical in 1 year Pap in 3 if normal Mammogram at 41.

## 2018-04-17 ENCOUNTER — Telehealth: Payer: Self-pay | Admitting: Adult Health

## 2018-04-17 LAB — COMPREHENSIVE METABOLIC PANEL
ALT: 14 IU/L (ref 0–32)
AST: 16 IU/L (ref 0–40)
Albumin/Globulin Ratio: 2 (ref 1.2–2.2)
Albumin: 4.5 g/dL (ref 3.5–5.5)
Alkaline Phosphatase: 76 IU/L (ref 39–117)
BUN/Creatinine Ratio: 8 — ABNORMAL LOW (ref 9–23)
BUN: 8 mg/dL (ref 6–20)
Bilirubin Total: 0.5 mg/dL (ref 0.0–1.2)
CALCIUM: 9.4 mg/dL (ref 8.7–10.2)
CO2: 22 mmol/L (ref 20–29)
CREATININE: 1 mg/dL (ref 0.57–1.00)
Chloride: 105 mmol/L (ref 96–106)
GFR, EST AFRICAN AMERICAN: 84 mL/min/{1.73_m2} (ref >59)
GFR, EST NON AFRICAN AMERICAN: 73 mL/min/{1.73_m2} (ref >59)
GLOBULIN, TOTAL: 2.3 g/dL (ref 1.5–4.5)
Glucose: 93 mg/dL (ref 65–99)
Potassium: 4.4 mmol/L (ref 3.5–5.2)
SODIUM: 141 mmol/L (ref 134–144)
TOTAL PROTEIN: 6.8 g/dL (ref 6.0–8.5)

## 2018-04-17 LAB — CBC
Hematocrit: 40 % (ref 34.0–46.6)
Hemoglobin: 14.4 g/dL (ref 11.1–15.9)
MCH: 30.8 pg (ref 26.6–33.0)
MCHC: 36 g/dL — ABNORMAL HIGH (ref 31.5–35.7)
MCV: 86 fL (ref 79–97)
PLATELETS: 186 10*3/uL (ref 150–450)
RBC: 4.68 x10E6/uL (ref 3.77–5.28)
RDW: 13.3 % (ref 12.3–15.4)
WBC: 5 10*3/uL (ref 3.4–10.8)

## 2018-04-17 LAB — LIPID PANEL
CHOL/HDL RATIO: 4.6 ratio — AB (ref 0.0–4.4)
CHOLESTEROL TOTAL: 199 mg/dL (ref 100–199)
HDL: 43 mg/dL (ref >39)
LDL CALC: 139 mg/dL — AB (ref 0–99)
TRIGLYCERIDES: 85 mg/dL (ref 0–149)
VLDL CHOLESTEROL CAL: 17 mg/dL (ref 5–40)

## 2018-04-17 LAB — TSH: TSH: 7.06 u[IU]/mL — ABNORMAL HIGH (ref 0.450–4.500)

## 2018-04-17 LAB — T4, FREE: Free T4: 1.22 ng/dL (ref 0.82–1.77)

## 2018-04-17 MED ORDER — LEVOTHYROXINE SODIUM 75 MCG PO TABS: 75.0000 ug | ORAL_TABLET | Freq: Every day | ORAL | 1 refills | Status: DC

## 2018-04-17 NOTE — Telephone Encounter (Signed)
Pt aware of labs increase levothyroxine to 75 mcg, so can take 1 1/2 of the 50's you have at home will recheck TSH and free T 4 in about 8 weeks

## 2018-04-18 ENCOUNTER — Telehealth: Payer: Self-pay | Admitting: Adult Health

## 2018-04-18 LAB — CYTOLOGY - PAP
Adequacy: ABSENT
Chlamydia: NEGATIVE
DIAGNOSIS: NEGATIVE
HPV (WINDOPATH): DETECTED — AB
NEISSERIA GONORRHEA: NEGATIVE

## 2018-04-18 NOTE — Telephone Encounter (Signed)
Mailbox is full, if she calls needs colpo, has had 2 +HPV in a row now

## 2018-04-27 ENCOUNTER — Ambulatory Visit (INDEPENDENT_AMBULATORY_CARE_PROVIDER_SITE_OTHER): Payer: No Typology Code available for payment source | Admitting: Obstetrics & Gynecology

## 2018-04-27 ENCOUNTER — Encounter: Payer: Self-pay | Admitting: Obstetrics & Gynecology

## 2018-04-27 ENCOUNTER — Other Ambulatory Visit: Payer: Self-pay | Admitting: Obstetrics & Gynecology

## 2018-04-27 VITALS — BP 116/82 | HR 67 | Ht 62.0 in | Wt 164.0 lb

## 2018-04-27 DIAGNOSIS — R87810 Cervical high risk human papillomavirus (HPV) DNA test positive: Secondary | ICD-10-CM

## 2018-04-27 DIAGNOSIS — Z3202 Encounter for pregnancy test, result negative: Secondary | ICD-10-CM | POA: Diagnosis not present

## 2018-04-27 DIAGNOSIS — N841 Polyp of cervix uteri: Secondary | ICD-10-CM

## 2018-04-27 LAB — POCT URINE PREGNANCY: PREG TEST UR: NEGATIVE

## 2018-04-27 NOTE — Progress Notes (Signed)
Colposcopy Procedure Note:  Colposcopy Procedure Note  Indications: Pap smear 1 months ago showed: negative cytology +HPV. The prior pap showed same.  Prior cervical/vaginal disease: normal exam without visible pathology. Prior cervical treatment: no treatment.  Smoker:  No. New sexual partner:  No.    History of abnormal Pap: yes  Procedure Details  The risks and benefits of the procedure and Written informed consent obtained.  Speculum placed in vagina and excellent visualization of cervix achieved, cervix swabbed x 3 with acetic acid solution.  Findings: Cervix: there is am AWE lesion emanating from the endocervix, cannot remove it in total, but took 3 biopsies for evaluation; endocervical lesion noted  Vaginal inspection: normal without visible lesions. Vulvar colposcopy: vulvar colposcopy not performed.  Specimens: x3 endocervical lesion  Complications: none.  Plan: Will send MyChart message with her biospy report, pt agrees

## 2018-04-27 NOTE — Addendum Note (Signed)
Addended by: Tish FredericksonLANCASTER, Gerardo Caiazzo A on: 04/27/2018 10:58 AM   Modules accepted: Orders

## 2018-05-08 ENCOUNTER — Ambulatory Visit (INDEPENDENT_AMBULATORY_CARE_PROVIDER_SITE_OTHER): Payer: No Typology Code available for payment source | Admitting: Obstetrics & Gynecology

## 2018-05-08 ENCOUNTER — Other Ambulatory Visit: Payer: Self-pay

## 2018-05-08 ENCOUNTER — Encounter: Payer: Self-pay | Admitting: Obstetrics & Gynecology

## 2018-05-08 VITALS — BP 113/78 | HR 80 | Ht 62.0 in | Wt 166.0 lb

## 2018-05-08 DIAGNOSIS — R87613 High grade squamous intraepithelial lesion on cytologic smear of cervix (HGSIL): Secondary | ICD-10-CM

## 2018-05-08 NOTE — Progress Notes (Signed)
Follow up appointment for results  Chief Complaint  Patient presents with  . Follow-up    discuss results    Blood pressure 113/78, pulse 80, height 5\' 2"  (1.575 m), weight 166 lb (75.3 kg).  Biopsy HSIL with lesion originating in the endocervical canal    MEDS ordered this encounter: No orders of the defined types were placed in this encounter.   Orders for this encounter: No orders of the defined types were placed in this encounter.   Impression: HSIL of the cervix, s/p laser conization 2013, recurrence  Plan: Discussed treatment options at length, with recurrence and originating high in the endocervix TVH reasonable in someone who has completed child bearing  She desires definitive therapy with TVH but is going to check with her work  Follow Up: Return if symptoms worsen or fail to improve, for pt to consider surgical management options.       Face to face time:  15 minutes  Greater than 50% of the visit time was spent in counseling and coordination of care with the patient.  The summary and outline of the counseling and care coordination is summarized in the note above.   All questions were answered.  Past Medical History:  Diagnosis Date  . Abnormal Pap smear   . 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 HSV-2 (herpes simplex virus 2) infection   . Hyperthyroidism 12/28/2009  . IBS (irritable bowel syndrome)   . Nicotine addiction 08/05/2013  . Vaginal Pap smear, abnormal     Past Surgical History:  Procedure Laterality Date  . CERVICAL CONIZATION W/BX  01/25/2012   Procedure: CONIZATION CERVIX WITH BIOPSY;  Surgeon: Lazaro ArmsLuther H Eure, MD;  Location: [facility];  Service: Gynecology;  Laterality: N/A;  Laser conization of cervix  . CHOLECYSTECTOMY  2010   [place]  . RECTOVAGINAL FISTULA CLOSURE  2004  . WISDOM TOOTH EXTRACTION      OB History    Gravida  3   Para  2   Term      Preterm       AB  1   Living  2     SAB  1   TAB      Ectopic      Multiple      Live Births  2           No Known Allergies  Social History   Socioeconomic History  . Marital status: Legally Separated    Spouse name: Not on file  . Number of children: Not on file  . Years of education: Not on file  . Highest education level: Not on file  Occupational History  . Not on file  Social Needs  . Financial resource strain: Not on file  . Food insecurity:    Worry: Not on file    Inability: Not on file  . Transportation needs:    Medical: Not on file    Non-medical: Not on file  Tobacco Use  . Smoking status: Former Smoker    Packs/day: 0.50    Years: 15.00    Pack years: 7.50    Types: Cigarettes  . Smokeless tobacco: Never Used  Substance and Sexual Activity  . Alcohol use: No  . Drug use: No  . Sexual activity: Yes    Birth control/protection: Injection  Lifestyle  . Physical activity:    Days per week: Not on file  Minutes per session: Not on file  . Stress: Not on file  Relationships  . Social connections:    Talks on phone: Not on file    Gets together: Not on file    Attends religious service: Not on file    Active member of club or organization: Not on file    Attends meetings of clubs or organizations: Not on file    Relationship status: Not on file  Other Topics Concern  . Not on file  Social History Narrative  . Not on file    Family History  Problem Relation Age of Onset  . Cancer Father        stomach   . COPD Father   . Cancer Maternal Grandmother        Breast   . Diabetes Mother   . COPD Mother   . Fibromyalgia Mother   . Emphysema Mother   . Asthma Daughter   . Diabetes Other   . Hypertension Other   . Coronary artery disease Other   . Anesthesia problems Neg Hx   . Hypotension Neg Hx   . Malignant hyperthermia Neg Hx   . Pseudochol deficiency Neg Hx

## 2018-05-15 ENCOUNTER — Encounter: Payer: Self-pay | Admitting: *Deleted

## 2018-05-17 ENCOUNTER — Encounter: Payer: Self-pay | Admitting: *Deleted

## 2018-05-17 ENCOUNTER — Other Ambulatory Visit: Payer: Self-pay

## 2018-05-17 ENCOUNTER — Ambulatory Visit (INDEPENDENT_AMBULATORY_CARE_PROVIDER_SITE_OTHER): Payer: No Typology Code available for payment source | Admitting: *Deleted

## 2018-05-17 DIAGNOSIS — Z3042 Encounter for surveillance of injectable contraceptive: Secondary | ICD-10-CM

## 2018-05-17 DIAGNOSIS — Z3202 Encounter for pregnancy test, result negative: Secondary | ICD-10-CM

## 2018-05-17 LAB — POCT URINE PREGNANCY: PREG TEST UR: NEGATIVE

## 2018-05-17 MED ORDER — MEDROXYPROGESTERONE ACETATE 150 MG/ML IM SUSP
150.0000 mg | Freq: Once | INTRAMUSCULAR | Status: AC
  Administered 2018-05-17: 150 mg via INTRAMUSCULAR

## 2018-05-17 NOTE — Progress Notes (Signed)
Pt given DepoProvera 150mg  IM left deltoid without complications

## 2018-05-24 ENCOUNTER — Encounter: Payer: Self-pay | Admitting: Obstetrics & Gynecology

## 2018-05-24 ENCOUNTER — Ambulatory Visit (INDEPENDENT_AMBULATORY_CARE_PROVIDER_SITE_OTHER): Payer: No Typology Code available for payment source | Admitting: Obstetrics & Gynecology

## 2018-05-24 VITALS — BP 126/86 | HR 76 | Wt 166.0 lb

## 2018-05-24 DIAGNOSIS — Z01818 Encounter for other preprocedural examination: Secondary | ICD-10-CM

## 2018-05-24 DIAGNOSIS — R87613 High grade squamous intraepithelial lesion on cytologic smear of cervix (HGSIL): Secondary | ICD-10-CM | POA: Diagnosis not present

## 2018-05-24 NOTE — Progress Notes (Signed)
Preoperative History and Physical  Nicole Bell is a 37 y.o. [id number] with No LMP recorded. Patient has had an injection. 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  Pt wants definitive surgical management for recureent HSIL She has completed her family  PMH:    Past Medical History:  Diagnosis Date  . Abnormal Pap smear   . 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 HSV-2 (herpes simplex virus 2) infection   . Hyperthyroidism 12/28/2009  . IBS (irritable bowel syndrome)   . Nicotine addiction 08/05/2013  . Vaginal Pap smear, abnormal     PSH:     Past Surgical History:  Procedure Laterality Date  . CERVICAL CONIZATION W/BX  01/25/2012   Procedure: CONIZATION CERVIX WITH BIOPSY;  Surgeon: Lazaro Arms, MD;  Location: [facility];  Service: Gynecology;  Laterality: N/A;  Laser conization of cervix  . CHOLECYSTECTOMY  2010   [place]  . RECTOVAGINAL FISTULA CLOSURE  2004  . WISDOM TOOTH EXTRACTION      POb/GynH:      OB History    Gravida  3   Para  2   Term      Preterm      AB  1   Living  2     SAB  1   TAB      Ectopic      Multiple      Live Births  2           SH:   Social History   Tobacco Use  . Smoking status: Former Smoker    Packs/day: 0.50    Years: 15.00    Pack years: 7.50    Types: Cigarettes  . Smokeless tobacco: Never Used  Substance Use Topics  . Alcohol use: No  . Drug use: No    FH:    Family History  Problem Relation Age of Onset  . Cancer Father        stomach   . COPD Father   . Cancer Maternal Grandmother        Breast   . Diabetes Mother   . COPD Mother   . Fibromyalgia Mother   . Emphysema Mother   . Asthma Daughter   . Diabetes Other   . Hypertension Other   . Coronary artery disease Other   . Anesthesia  problems Neg Hx   . Hypotension Neg Hx   . Malignant hyperthermia Neg Hx   . Pseudochol deficiency Neg Hx      Allergies: No Known Allergies  Medications:       Current Outpatient Medications:  .  Aspirin-Acetaminophen-Caffeine (EXCEDRIN MIGRAINE PO), Take by mouth as needed., Disp: , Rfl:  .  Ciprofloxacin HCl 0.2 % otic solution, , Disp: , Rfl:  .  levothyroxine (SYNTHROID, LEVOTHROID) 75 MCG tablet, Take 1 tablet (75 mcg total) by mouth daily before breakfast., Disp: 30 tablet, Rfl: 1 .  medroxyPROGESTERone Acetate 150 MG/ML SUSY, INJECT IN PHYSICIANS OFFICE., Disp: 1 mL, Rfl: 4  Review of Systems:   Review of Systems  Constitutional: Negative for fever, chills, weight loss, malaise/fatigue and diaphoresis.  HENT: Negative for hearing loss, ear pain, nosebleeds, congestion, sore throat, neck pain, tinnitus and ear discharge.   Eyes: Negative for blurred vision, double vision,  photophobia, pain, discharge and redness.  Respiratory: Negative for cough, hemoptysis, sputum production, shortness of breath, wheezing and stridor.   Cardiovascular: Negative for chest pain, palpitations, orthopnea, claudication, leg swelling and PND.  Gastrointestinal: Positive for abdominal pain. Negative for heartburn, nausea, vomiting, diarrhea, constipation, blood in stool and melena.  Genitourinary: Negative for dysuria, urgency, frequency, hematuria and flank pain.  Musculoskeletal: Negative for myalgias, back pain, joint pain and falls.  Skin: Negative for itching and rash.  Neurological: Negative for dizziness, tingling, tremors, sensory change, speech change, focal weakness, seizures, loss of consciousness, weakness and headaches.  Endo/Heme/Allergies: Negative for environmental allergies and polydipsia. Does not bruise/bleed easily.  Psychiatric/Behavioral: Negative for depression, suicidal ideas, hallucinations, memory loss and substance abuse. The patient is not nervous/anxious and does not have  insomnia.      PHYSICAL EXAM:  Blood pressure 126/86, pulse 76, weight 166 lb (75.3 kg).    Vitals reviewed. Constitutional: She is oriented to person, place, and time. She appears well-developed and well-nourished.  HENT:  Head: Normocephalic and atraumatic.  Right Ear: External ear normal.  Left Ear: External ear normal.  Nose: Nose normal.  Mouth/Throat: Oropharynx is clear and moist.  Eyes: Conjunctivae and EOM are normal. Pupils are equal, round, and reactive to light. Right eye exhibits no discharge. Left eye exhibits no discharge. No scleral icterus.  Neck: Normal range of motion. Neck supple. No tracheal deviation present. No thyromegaly present.  Cardiovascular: Normal rate, regular rhythm, normal heart sounds and intact distal pulses.  Exam reveals no gallop and no friction rub.   No murmur heard. Respiratory: Effort normal and breath sounds normal. No respiratory distress. She has no wheezes. She has no rales. She exhibits no tenderness.  GI: Soft. Bowel sounds are normal. She exhibits no distension and no mass. There is tenderness. There is no rebound and no guarding.  Genitourinary:       Vulva is normal without lesions Vagina is pink moist without discharge Cervix normal in appearance  Uterus is normal size, contour, position, consistency, mobility, non-tender Adnexa is negative with normal sized ovaries by sonogram  Musculoskeletal: Normal range of motion. She exhibits no edema and no tenderness.  Neurological: She is alert and oriented to person, place, and time. She has normal reflexes. She displays normal reflexes. No cranial nerve deficit. She exhibits normal muscle tone. Coordination normal.  Skin: Skin is warm and dry. No rash noted. No erythema. No pallor.  Psychiatric: She has a normal mood and affect. Her behavior is normal. Judgment and thought content normal.    Labs: Results for orders placed or performed in visit on 05/17/18 (from the past 336 hour(s))   POCT urine pregnancy   Collection Time: 05/17/18  1:39 PM  Result Value Ref Range   Preg Test, Ur Negative Negative    EKG: No orders found for this or any previous visit.  Imaging Studies: No results found.    Assessment:  HSIL of the cervix, recurrent Desires definitive surgical management  Patient Active Problem List   Diagnosis Date Noted  . Elevated cholesterol 04/16/2018  . Hypothyroidism 04/16/2018  . Surveillance for Depo-Provera contraception 04/16/2018  . Hypothyroid 04/17/2017  . Cervical high risk human papillomavirus (HPV) DNA test positive 04/17/2017  . Encounter for surveillance of injectable contraceptive 04/12/2017  . Encounter for gynecological examination with Papanicolaou smear of cervix 04/12/2017  . Nicotine addiction 08/05/2013  . IBS (irritable bowel syndrome) 08/05/2013  . Contraceptive management 08/05/2013    Plan: Vaginal hysterectomy  with removal of both Fallopian tubes, if possible, preserve ovaries 06/13/2018  Amaryllis Dyke Eure 05/24/2018 1:13 PM     Face to face time:  15 minutes  Greater than 50% of the visit time was spent in counseling and coordination of care with the patient.  The summary and outline of the counseling and care coordination is summarized in the note above.   All questions were answered.

## 2018-06-01 ENCOUNTER — Other Ambulatory Visit (HOSPITAL_COMMUNITY): Payer: No Typology Code available for payment source

## 2018-06-06 ENCOUNTER — Other Ambulatory Visit: Payer: Self-pay | Admitting: Obstetrics & Gynecology

## 2018-06-06 NOTE — Patient Instructions (Signed)
Nicole SkeansJamie C Bell  06/06/2018     @PREFPERIOPPHARMACY @   Your procedure is scheduled on  06/13/2018  [facility]  [facility]  A.M.  Call this number if you have problems the morning of surgery:  [phone]   Remember:  Do not eat or drink after midnight.  You may drink clear liquids until  12 midnight 06/12/2018 .  Clear liquids allowed are:                    Water, Juice (non-citric and without pulp), Carbonated beverages, Clear Tea, Black Coffee only, Plain Jell-O only, Gatorade and Plain Popsicles only    Take these medicines the morning of surgery with A SIP OF WATER  Levothyroxine.    Do not wear jewelry, make-up or nail polish.  Do not wear lotions, powders, or perfumes, or deodorant.  Do not shave 48 hours prior to surgery.  Men may shave face and neck.  Do not bring valuables to the hospital.  [facility] belongings or valuables.  Contacts, dentures or bridgework may not be worn into surgery.  Leave your suitcase in the car.  After surgery it may be brought to your room.  For patients admitted to the hospital, discharge time will be determined by your treatment team.  Patients discharged the day of surgery will not be allowed to drive home.   Name and phone number of your driver:   family Special instructions:  None  Please read over the following fact sheets that you were given. Pain Booklet, Coughing and Deep Breathing, Blood Transfusion Information, Surgical Site Infection Prevention, Anesthesia Post-op Instructions and Care and Recovery After Surgery       Vaginal Hysterectomy A vaginal hysterectomy is a procedure to remove all or part of the uterus through a small incision in the vagina. In this procedure, your health care provider may remove your entire uterus, including the lower end (cervix). You may need a vaginal hysterectomy to treat:  Uterine fibroids.  A condition that causes the lining of the uterus to  grow in other areas (endometriosis).  Problems with pelvic support.  Cancer of the cervix, ovaries, uterus, or tissue that lines the uterus (endometrium).  Excessive (dysfunctional) uterine bleeding.  When removing your uterus, your health care provider may also remove the organs that produce eggs (ovaries) and the tubes that carry eggs to your uterus (fallopian tubes). After a vaginal hysterectomy, you will no longer be able to have a baby. You will also no longer get your menstrual period. Tell a health care provider about:  Any allergies you have.  All medicines you are taking, including vitamins, herbs, eye drops, creams, and over-the-counter medicines.  Any problems you or family members have had with anesthetic medicines.  Any blood disorders you have.  Any surgeries you have had.  Any medical conditions you have.  Whether you are pregnant or may be pregnant. What are the risks? Generally, this is a safe procedure. However, problems may occur, including:  Bleeding.  Infection.  A blood clot that forms in your leg and travels to your lungs (pulmonary embolism).  Damage to surrounding organs.  Pain during sex.  What happens before the procedure?  Ask your health care provider what organs will be removed during surgery.  Ask your health care provider about: ? Changing or stopping your regular medicines. This is especially important if you are  taking diabetes medicines or blood thinners. ? Taking medicines such as aspirin and ibuprofen. These medicines can thin your blood. Do not take these medicines before your procedure if your health care provider instructs you not to.  Follow instructions from your health care provider about eating or drinking restrictions.  Do not use any tobacco products, such as cigarettes, chewing tobacco, and e-cigarettes. If you need help quitting, ask your health care provider.  Plan to have someone take you home after discharge from the  hospital. What happens during the procedure?  To reduce your risk of infection: ? Your health care team will wash or sanitize their hands. ? Your skin will be washed with soap.  An IV tube will be inserted into one of your veins.  You may be given antibiotic medicine to help prevent infection.  You will be given one or more of the following: ? A medicine to help you relax (sedative). ? A medicine to numb the area (local anesthetic). ? A medicine to make you fall asleep (general anesthetic). ? A medicine that is injected into an area of your body to numb everything beyond the injection site (regional anesthetic).  Your surgeon will make an incision in your vagina.  Your surgeon will locate and remove all or part of your uterus.  Your ovaries and fallopian tubes may be removed at the same time.  The incision will be closed with stitches (sutures) that dissolve over time. The procedure may vary among health care providers and hospitals. What happens after the procedure?  Your blood pressure, heart rate, breathing rate, and blood oxygen level will be monitored often until the medicines you were given have worn off.  You will be encouraged to get up and walk around after a few hours to help prevent complications.  You may have IV tubes in place for a few days.  You will be given pain medicine as needed.  Do not drive for 24 hours if you were given a sedative. This information is not intended to replace advice given to you by your health care provider. Make sure you discuss any questions you have with your health care provider. Document Released: 12/28/2015 Document Revised: 02/11/2016 Document Reviewed: 09/20/2015 Elsevier Interactive Patient Education  2018 [employer]  Vaginal Hysterectomy, Care After Refer to this sheet in the next few weeks. These instructions provide you with information about caring for yourself after your procedure. Your health care provider may also  give you more specific instructions. Your treatment has been planned according to current medical practices, but problems sometimes occur. Call your health care provider if you have any problems or questions after your procedure. What can I expect after the procedure? After the procedure, it is common to have:  Pain.  Soreness and numbness in your incision areas.  Vaginal bleeding and discharge.  Constipation.  Temporary problems emptying the bladder.  Feelings of sadness or other emotions.  Follow these instructions at home: Medicines  Take over-the-counter and prescription medicines only as told by your health care provider.  If you were prescribed an antibiotic medicine, take it as told by your health care provider. Do not stop taking the antibiotic even if you start to feel better.  Do not drive or operate heavy machinery while taking prescription pain medicine. Activity  Return to your normal activities as told by your health care provider. Ask your health care provider what activities are safe for you.  Get regular exercise as told by your health  care provider. You may be told to take short walks every day and go farther each time.  Do not lift anything that is heavier than 10 lb (4.5 kg). General instructions   Do not put anything in your vagina for 6 weeks after your surgery or as told by your health care provider. This includes tampons and douches.  Do not have sex until your health care provider says you can.  Do not take baths, swim, or use a hot tub until your health care provider approves.  Drink enough fluid to keep your urine clear or pale yellow.  Do not drive for 24 hours if you were given a sedative.  Keep all follow-up visits as told by your health care provider. This is important. Contact a health care provider if:  Your pain medicine is not helping.  You have a fever.  You have redness, swelling, or pain at your incision site.  You have blood,  pus, or a bad-smelling discharge from your vagina.  You continue to have difficulty urinating. Get help right away if:  You have severe abdominal or back pain.  You have heavy bleeding from your vagina.  You have chest pain or shortness of breath. This information is not intended to replace advice given to you by your health care provider. Make sure you discuss any questions you have with your health care provider. Document Released: 12/28/2015 Document Revised: 02/11/2016 Document Reviewed: 09/20/2015 Elsevier Interactive Patient Education  2018 [employer].  General Anesthesia, Adult General anesthesia is the use of medicines to make a person "go to sleep" (be unconscious) for a medical procedure. General anesthesia is often recommended when a procedure:  Is long.  Requires you to be still or in an unusual position.  Is major and can cause you to lose blood.  Is impossible to do without general anesthesia.  The medicines used for general anesthesia are called general anesthetics. In addition to making you sleep, the medicines:  Prevent pain.  Control your blood pressure.  Relax your muscles.  Tell a health care provider about:  Any allergies you have.  All medicines you are taking, including vitamins, herbs, eye drops, creams, and over-the-counter medicines.  Any problems you or family members have had with anesthetic medicines.  Types of anesthetics you have had in the past.  Any bleeding disorders you have.  Any surgeries you have had.  Any medical conditions you have.  Any history of heart or lung conditions, such as heart failure, sleep apnea, or chronic obstructive pulmonary disease (COPD).  Whether you are pregnant or may be pregnant.  Whether you use tobacco, alcohol, marijuana, or street drugs.  Any history of Financial planner.  Any history of depression or anxiety. What are the risks? Generally, this is a safe procedure. However, problems may  occur, including:  Allergic reaction to anesthetics.  Lung and heart problems.  Inhaling food or liquids from your stomach into your lungs (aspiration).  Injury to nerves.  Waking up during your procedure and being unable to move (rare).  Extreme agitation or a state of mental confusion (delirium) when you wake up from the anesthetic.  Air in the bloodstream, which can lead to stroke.  These problems are more likely to develop if you are having a major surgery or if you have an advanced medical condition. You can prevent some of these complications by answering all of your health care provider's questions thoroughly and by following all pre-procedure instructions. General anesthesia can cause  side effects, including:  Nausea or vomiting  A sore throat from the breathing tube.  Feeling cold or shivery.  Feeling tired, washed out, or achy.  Sleepiness or drowsiness.  Confusion or agitation.  What happens before the procedure? Staying hydrated Follow instructions from your health care provider about hydration, which may include:  Up to 2 hours before the procedure - you may continue to drink clear liquids, such as water, clear fruit juice, black coffee, and plain tea.  Eating and drinking restrictions Follow instructions from your health care provider about eating and drinking, which may include:  8 hours before the procedure - stop eating heavy meals or foods such as meat, fried foods, or fatty foods.  6 hours before the procedure - stop eating light meals or foods, such as toast or cereal.  6 hours before the procedure - stop drinking milk or drinks that contain milk.  2 hours before the procedure - stop drinking clear liquids.  Medicines  Ask your health care provider about: ? Changing or stopping your regular medicines. This is especially important if you are taking diabetes medicines or blood thinners. ? Taking medicines such as aspirin and ibuprofen. These  medicines can thin your blood. Do not take these medicines before your procedure if your health care provider instructs you not to. ? Taking new dietary supplements or medicines. Do not take these during the week before your procedure unless your health care provider approves them.  If you are told to take a medicine or to continue taking a medicine on the day of the procedure, take the medicine with sips of water. General instructions   Ask if you will be going home the same day, the following day, or after a longer hospital stay. ? Plan to have someone take you home. ? Plan to have someone stay with you for the first 24 hours after you leave the hospital or clinic.  For 3-6 weeks before the procedure, try not to use any tobacco products, such as cigarettes, chewing tobacco, and e-cigarettes.  You may brush your teeth on the morning of the procedure, but make sure to spit out the toothpaste. What happens during the procedure?  You will be given anesthetics through a mask and through an IV tube in one of your veins.  You may receive medicine to help you relax (sedative).  As soon as you are asleep, a breathing tube may be used to help you breathe.  An anesthesia specialist will stay with you throughout the procedure. He or she will help keep you comfortable and safe by continuing to give you medicines and adjusting the amount of medicine that you get. He or she will also watch your blood pressure, pulse, and oxygen levels to make sure that the anesthetics do not cause any problems.  If a breathing tube was used to help you breathe, it will be removed before you wake up. The procedure may vary among health care providers and hospitals. What happens after the procedure?  You will wake up, often slowly, after the procedure is complete, usually in a recovery area.  Your blood pressure, heart rate, breathing rate, and blood oxygen level will be monitored until the medicines you were given  have worn off.  You may be given medicine to help you calm down if you feel anxious or agitated.  If you will be going home the same day, your health care provider may check to make sure you can stand, drink, and urinate.  Your health care providers will treat your pain and side effects before you go home.  Do not drive for 24 hours if you received a sedative.  You may: ? Feel nauseous and vomit. ? Have a sore throat. ? Have mental slowness. ? Feel cold or shivery. ? Feel sleepy. ? Feel tired. ? Feel sore or achy, even in parts of your body where you did not have surgery. This information is not intended to replace advice given to you by your health care provider. Make sure you discuss any questions you have with your health care provider. Document Released: 12/13/2007 Document Revised: 02/16/2016 Document Reviewed: 08/20/2015 Elsevier Interactive Patient Education  2018 [employer]. General Anesthesia, Adult, Care After These instructions provide you with information about caring for yourself after your procedure. Your health care provider may also give you more specific instructions. Your treatment has been planned according to current medical practices, but problems sometimes occur. Call your health care provider if you have any problems or questions after your procedure. What can I expect after the procedure? After the procedure, it is common to have:  Vomiting.  A sore throat.  Mental slowness.  It is common to feel:  Nauseous.  Cold or shivery.  Sleepy.  Tired.  Sore or achy, even in parts of your body where you did not have surgery.  Follow these instructions at home: For at least 24 hours after the procedure:  Do not: ? Participate in activities where you could fall or become injured. ? Drive. ? Use heavy machinery. ? Drink alcohol. ? Take sleeping pills or medicines that cause drowsiness. ? Make important decisions or sign legal documents. ? Take care  of children on your own.  Rest. Eating and drinking  If you vomit, drink water, juice, or soup when you can drink without vomiting.  Drink enough fluid to keep your urine clear or pale yellow.  Make sure you have little or no nausea before eating solid foods.  Follow the diet recommended by your health care provider. General instructions  Have a responsible adult stay with you until you are awake and alert.  Return to your normal activities as told by your health care provider. Ask your health care provider what activities are safe for you.  Take over-the-counter and prescription medicines only as told by your health care provider.  If you smoke, do not smoke without supervision.  Keep all follow-up visits as told by your health care provider. This is important. Contact a health care provider if:  You continue to have nausea or vomiting at home, and medicines are not helpful.  You cannot drink fluids or start eating again.  You cannot urinate after 8-12 hours.  You develop a skin rash.  You have fever.  You have increasing redness at the site of your procedure. Get help right away if:  You have difficulty breathing.  You have chest pain.  You have unexpected bleeding.  You feel that you are having a life-threatening or urgent problem. This information is not intended to replace advice given to you by your health care provider. Make sure you discuss any questions you have with your health care provider. Document Released: 12/12/2000 Document Revised: 02/08/2016 Document Reviewed: 08/20/2015 Elsevier Interactive Patient Education  [employer].

## 2018-06-08 ENCOUNTER — Encounter (HOSPITAL_COMMUNITY)
Admission: RE | Admit: 2018-06-08 | Discharge: 2018-06-08 | Disposition: A | Discharge status: Home / Self Care | Payer: No Typology Code available for payment source | Source: Ambulatory Visit | Attending: Obstetrics & Gynecology | Admitting: Obstetrics & Gynecology

## 2018-06-08 ENCOUNTER — Other Ambulatory Visit: Payer: Self-pay

## 2018-06-08 ENCOUNTER — Encounter (HOSPITAL_COMMUNITY): Payer: Self-pay

## 2018-06-08 DIAGNOSIS — Z01812 Encounter for preprocedural laboratory examination: Secondary | ICD-10-CM | POA: Insufficient documentation

## 2018-06-08 LAB — URINALYSIS, ROUTINE W REFLEX MICROSCOPIC
BILIRUBIN URINE: NEGATIVE
GLUCOSE, UA: NEGATIVE mg/dL
HGB URINE DIPSTICK: NEGATIVE
Ketones, ur: NEGATIVE mg/dL
Leukocytes, UA: NEGATIVE
Nitrite: NEGATIVE
PH: 5 (ref 5.0–8.0)
Protein, ur: NEGATIVE mg/dL
SPECIFIC GRAVITY, URINE: 1.02 (ref 1.005–1.030)

## 2018-06-08 LAB — COMPREHENSIVE METABOLIC PANEL
ALT: 15 U/L (ref 0–44)
ANION GAP: 7 (ref 5–15)
AST: 16 U/L (ref 15–41)
Albumin: 4.3 g/dL (ref 3.5–5.0)
Alkaline Phosphatase: 72 U/L (ref 38–126)
BUN: 9 mg/dL (ref 6–20)
CHLORIDE: 108 mmol/L (ref 98–111)
CO2: 25 mmol/L (ref 22–32)
Calcium: 9 mg/dL (ref 8.9–10.3)
Creatinine, Ser: 0.84 mg/dL (ref 0.44–1.00)
Glucose, Bld: 96 mg/dL (ref 70–99)
POTASSIUM: 3.5 mmol/L (ref 3.5–5.1)
Sodium: 140 mmol/L (ref 135–145)
TOTAL PROTEIN: 7.2 g/dL (ref 6.5–8.1)
Total Bilirubin: 0.9 mg/dL (ref 0.3–1.2)

## 2018-06-08 LAB — CBC
HCT: 38.5 % (ref 36.0–46.0)
Hemoglobin: 13.1 g/dL (ref 12.0–15.0)
MCH: 29.8 pg (ref 26.0–34.0)
MCHC: 34 g/dL (ref 30.0–36.0)
MCV: 87.7 fL (ref 78.0–100.0)
PLATELETS: 173 10*3/uL (ref 150–400)
RBC: 4.39 MIL/uL (ref 3.87–5.11)
RDW: 12.6 % (ref 11.5–15.5)
WBC: 4.7 10*3/uL (ref 4.0–10.5)

## 2018-06-08 LAB — RAPID HIV SCREEN (HIV 1/2 AB+AG)
HIV 1/2 ANTIBODIES: NONREACTIVE
HIV-1 P24 ANTIGEN - HIV24: NONREACTIVE

## 2018-06-08 LAB — HCG, QUANTITATIVE, PREGNANCY: hCG, Beta Chain, Quant, S: <1 m[IU]/mL (ref <5)

## 2018-06-08 MED FILL — LEVOTHYROXINE 75 MCG TABLET: 75 | 30 days supply | Qty: 30 | Fill #0

## 2018-06-13 ENCOUNTER — Observation Stay (HOSPITAL_COMMUNITY)
Admission: RE | Admit: 2018-06-13 | Discharge: 2018-06-13 | Disposition: A | Discharge status: Home / Self Care | Payer: No Typology Code available for payment source | Source: Ambulatory Visit | Attending: Obstetrics & Gynecology | Admitting: Obstetrics & Gynecology

## 2018-06-13 ENCOUNTER — Encounter (HOSPITAL_COMMUNITY): Payer: Self-pay

## 2018-06-13 ENCOUNTER — Other Ambulatory Visit: Payer: Self-pay

## 2018-06-13 ENCOUNTER — Encounter (HOSPITAL_COMMUNITY)
Admission: RE | Disposition: A | Discharge status: Home / Self Care | Payer: Self-pay | Source: Ambulatory Visit | Attending: Obstetrics & Gynecology

## 2018-06-13 ENCOUNTER — Ambulatory Visit (HOSPITAL_COMMUNITY): Payer: No Typology Code available for payment source | Admitting: Anesthesiology

## 2018-06-13 DIAGNOSIS — R87613 High grade squamous intraepithelial lesion on cytologic smear of cervix (HGSIL): Secondary | ICD-10-CM | POA: Diagnosis present

## 2018-06-13 DIAGNOSIS — E039 Hypothyroidism, unspecified: Secondary | ICD-10-CM | POA: Diagnosis not present

## 2018-06-13 DIAGNOSIS — Z79899 Other long term (current) drug therapy: Secondary | ICD-10-CM | POA: Diagnosis not present

## 2018-06-13 DIAGNOSIS — D069 Carcinoma in situ of cervix, unspecified: Secondary | ICD-10-CM | POA: Diagnosis not present

## 2018-06-13 DIAGNOSIS — Z90710 Acquired absence of both cervix and uterus: Secondary | ICD-10-CM | POA: Diagnosis present

## 2018-06-13 DIAGNOSIS — E7800 Pure hypercholesterolemia, unspecified: Secondary | ICD-10-CM | POA: Insufficient documentation

## 2018-06-13 DIAGNOSIS — Z8249 Family history of ischemic heart disease and other diseases of the circulatory system: Secondary | ICD-10-CM | POA: Diagnosis not present

## 2018-06-13 DIAGNOSIS — K589 Irritable bowel syndrome without diarrhea: Secondary | ICD-10-CM | POA: Diagnosis not present

## 2018-06-13 DIAGNOSIS — Z87891 Personal history of nicotine dependence: Secondary | ICD-10-CM | POA: Insufficient documentation

## 2018-06-13 HISTORY — PX: VAGINAL HYSTERECTOMY: SHX2639

## 2018-06-13 LAB — BASIC METABOLIC PANEL
ANION GAP: 5 (ref 5–15)
BUN: 7 mg/dL (ref 6–20)
CO2: 23 mmol/L (ref 22–32)
Calcium: 8.6 mg/dL — ABNORMAL LOW (ref 8.9–10.3)
Chloride: 107 mmol/L (ref 98–111)
Creatinine, Ser: 0.83 mg/dL (ref 0.44–1.00)
GFR calc Af Amer: >60 mL/min (ref >60)
Glucose, Bld: 99 mg/dL (ref 70–99)
POTASSIUM: 3.9 mmol/L (ref 3.5–5.1)
Sodium: 135 mmol/L (ref 135–145)

## 2018-06-13 LAB — CBC
HCT: 34.5 % — ABNORMAL LOW (ref 36.0–46.0)
Hemoglobin: 11.9 g/dL — ABNORMAL LOW (ref 12.0–15.0)
MCH: 30.1 pg (ref 26.0–34.0)
MCHC: 34.5 g/dL (ref 30.0–36.0)
MCV: 87.1 fL (ref 78.0–100.0)
PLATELETS: 163 10*3/uL (ref 150–400)
RBC: 3.96 MIL/uL (ref 3.87–5.11)
RDW: 12.5 % (ref 11.5–15.5)
WBC: 6.5 10*3/uL (ref 4.0–10.5)

## 2018-06-13 SURGERY — HYSTERECTOMY, VAGINAL
Anesthesia: General

## 2018-06-13 MED ORDER — BUPIVACAINE-EPINEPHRINE (PF) 0.5% -1:200000 IJ SOLN
INTRAMUSCULAR | Status: DC | PRN
  Administered 2018-06-13: 20 mL via PERINEURAL

## 2018-06-13 MED ORDER — ONDANSETRON HCL 4 MG/2ML IJ SOLN
INTRAMUSCULAR | Status: DC | PRN
  Administered 2018-06-13: 4 mg via INTRAVENOUS

## 2018-06-13 MED ORDER — KETOROLAC TROMETHAMINE 30 MG/ML IJ SOLN
30.0000 mg | Freq: Once | INTRAMUSCULAR | Status: AC
  Administered 2018-06-13: 30 mg via INTRAVENOUS
  Filled 2018-06-13: qty 1

## 2018-06-13 MED ORDER — ROCURONIUM BROMIDE 50 MG/5ML IV SOLN
INTRAVENOUS | Status: AC
  Filled 2018-06-13: qty 1

## 2018-06-13 MED ORDER — BUPIVACAINE-EPINEPHRINE (PF) 0.5% -1:200000 IJ SOLN
INTRAMUSCULAR | Status: AC
  Filled 2018-06-13: qty 30

## 2018-06-13 MED ORDER — SIMETHICONE 80 MG PO CHEW: 80.0000 mg | CHEWABLE_TABLET | Freq: Four times a day (QID) | ORAL | Status: DC | PRN

## 2018-06-13 MED ORDER — LEVOTHYROXINE SODIUM 75 MCG PO TABS: 75.0000 ug | ORAL_TABLET | Freq: Every day | ORAL | Status: DC

## 2018-06-13 MED ORDER — SODIUM CHLORIDE 0.9 % IV SOLN: 8.0000 mg | Freq: Four times a day (QID) | INTRAVENOUS | Status: DC | PRN

## 2018-06-13 MED ORDER — CEFAZOLIN SODIUM-DEXTROSE 2-4 GM/100ML-% IV SOLN
2.0000 g | INTRAVENOUS | Status: AC
  Administered 2018-06-13: 2 g via INTRAVENOUS

## 2018-06-13 MED ORDER — HYDROCODONE-ACETAMINOPHEN 7.5-325 MG PO TABS: 1.0000 | ORAL_TABLET | Freq: Once | ORAL | Status: DC | PRN

## 2018-06-13 MED ORDER — LACTATED RINGERS IV SOLN: INTRAVENOUS | Status: DC

## 2018-06-13 MED ORDER — OXYCODONE-ACETAMINOPHEN 5-325 MG PO TABS
2.0000 | ORAL_TABLET | ORAL | Status: DC | PRN
  Administered 2018-06-13: 2 via ORAL
  Filled 2018-06-13: qty 2

## 2018-06-13 MED ORDER — SUCCINYLCHOLINE CHLORIDE 20 MG/ML IJ SOLN
INTRAMUSCULAR | Status: AC
  Filled 2018-06-13: qty 1

## 2018-06-13 MED ORDER — FENTANYL CITRATE (PF) 250 MCG/5ML IJ SOLN
INTRAMUSCULAR | Status: AC
  Filled 2018-06-13: qty 5

## 2018-06-13 MED ORDER — FLUCONAZOLE 100 MG PO TABS: 100.0000 mg | ORAL_TABLET | Freq: Every day | ORAL | 0 refills | Status: DC

## 2018-06-13 MED ORDER — GLYCOPYRROLATE 0.2 MG/ML IJ SOLN
INTRAMUSCULAR | Status: DC | PRN
  Administered 2018-06-13: .2 mg via INTRAVENOUS

## 2018-06-13 MED ORDER — DIPHENHYDRAMINE HCL 50 MG/ML IJ SOLN: 25.0000 mg | Freq: Four times a day (QID) | INTRAMUSCULAR | Status: DC | PRN

## 2018-06-13 MED ORDER — PROPOFOL 10 MG/ML IV BOLUS
INTRAVENOUS | Status: DC | PRN
  Administered 2018-06-13: 180 mg via INTRAVENOUS
  Administered 2018-06-13: 20 mg via INTRAVENOUS

## 2018-06-13 MED ORDER — CIPROFLOXACIN HCL 500 MG PO TABS: 500.0000 mg | ORAL_TABLET | Freq: Two times a day (BID) | ORAL | 0 refills | Status: DC

## 2018-06-13 MED ORDER — KETOROLAC TROMETHAMINE 30 MG/ML IJ SOLN
INTRAMUSCULAR | Status: AC
  Filled 2018-06-13: qty 1

## 2018-06-13 MED ORDER — ONDANSETRON HCL 4 MG/2ML IJ SOLN
INTRAMUSCULAR | Status: AC
  Filled 2018-06-13: qty 4

## 2018-06-13 MED ORDER — SUGAMMADEX SODIUM 200 MG/2ML IV SOLN
INTRAVENOUS | Status: AC
  Filled 2018-06-13: qty 2

## 2018-06-13 MED ORDER — 0.9 % SODIUM CHLORIDE (POUR BTL) OPTIME
TOPICAL | Status: DC | PRN
  Administered 2018-06-13: 1000 mL

## 2018-06-13 MED ORDER — STERILE WATER FOR IRRIGATION IR SOLN
Status: DC | PRN
  Administered 2018-06-13: 1000 mL

## 2018-06-13 MED ORDER — OXYCODONE-ACETAMINOPHEN 5-325 MG PO TABS: 2.0000 | ORAL_TABLET | ORAL | 0 refills | Status: DC | PRN

## 2018-06-13 MED ORDER — CEFAZOLIN SODIUM-DEXTROSE 2-4 GM/100ML-% IV SOLN
INTRAVENOUS | Status: AC
  Filled 2018-06-13: qty 100

## 2018-06-13 MED ORDER — ONDANSETRON HCL 4 MG PO TABS
8.0000 mg | ORAL_TABLET | Freq: Four times a day (QID) | ORAL | Status: DC | PRN
  Administered 2018-06-13: 8 mg via ORAL
  Filled 2018-06-13: qty 2

## 2018-06-13 MED ORDER — ENOXAPARIN SODIUM 40 MG/0.4ML ~~LOC~~ SOLN: 40.0000 mg | SUBCUTANEOUS | Status: DC

## 2018-06-13 MED ORDER — ONDANSETRON HCL 8 MG PO TABS: 8.0000 mg | ORAL_TABLET | Freq: Four times a day (QID) | ORAL | 0 refills | Status: DC | PRN

## 2018-06-13 MED ORDER — PROPOFOL 10 MG/ML IV BOLUS
INTRAVENOUS | Status: AC
  Filled 2018-06-13: qty 40

## 2018-06-13 MED ORDER — SENNOSIDES-DOCUSATE SODIUM 8.6-50 MG PO TABS: 1.0000 | ORAL_TABLET | Freq: Every evening | ORAL | Status: DC | PRN

## 2018-06-13 MED ORDER — BISACODYL 10 MG RE SUPP: 10.0000 mg | Freq: Every day | RECTAL | Status: DC | PRN

## 2018-06-13 MED ORDER — HYDROMORPHONE HCL 1 MG/ML IJ SOLN: 1.0000 mg | INTRAMUSCULAR | Status: DC | PRN

## 2018-06-13 MED ORDER — PROMETHAZINE HCL 25 MG/ML IJ SOLN: 25.0000 mg | Freq: Four times a day (QID) | INTRAMUSCULAR | Status: DC | PRN

## 2018-06-13 MED ORDER — KETOROLAC TROMETHAMINE 10 MG PO TABS: 10.0000 mg | ORAL_TABLET | Freq: Three times a day (TID) | ORAL | 0 refills | Status: DC | PRN

## 2018-06-13 MED ORDER — HYDROMORPHONE HCL 1 MG/ML IJ SOLN
0.2500 mg | INTRAMUSCULAR | Status: DC | PRN
  Administered 2018-06-13: 0.5 mg via INTRAVENOUS

## 2018-06-13 MED ORDER — KCL IN DEXTROSE-NACL 20-5-0.45 MEQ/L-%-% IV SOLN
INTRAVENOUS | Status: DC
  Administered 2018-06-13: 12:00:00 via INTRAVENOUS

## 2018-06-13 MED ORDER — DOCUSATE SODIUM 100 MG PO CAPS
100.0000 mg | ORAL_CAPSULE | Freq: Two times a day (BID) | ORAL | Status: DC
  Administered 2018-06-13: 100 mg via ORAL
  Filled 2018-06-13: qty 1

## 2018-06-13 MED ORDER — MEPERIDINE HCL 50 MG/ML IJ SOLN: 6.2500 mg | INTRAMUSCULAR | Status: DC | PRN

## 2018-06-13 MED ORDER — SUGAMMADEX SODIUM 500 MG/5ML IV SOLN
INTRAVENOUS | Status: DC | PRN
  Administered 2018-06-13: 200 mg via INTRAVENOUS

## 2018-06-13 MED ORDER — KETOROLAC TROMETHAMINE 30 MG/ML IJ SOLN
30.0000 mg | Freq: Once | INTRAMUSCULAR | Status: AC
  Administered 2018-06-13: 30 mg via INTRAVENOUS

## 2018-06-13 MED ORDER — HYDROMORPHONE HCL 1 MG/ML IJ SOLN
INTRAMUSCULAR | Status: AC
  Filled 2018-06-13: qty 0.5

## 2018-06-13 MED ORDER — LACTATED RINGERS IV SOLN
INTRAVENOUS | Status: DC
  Administered 2018-06-13 (×2): via INTRAVENOUS

## 2018-06-13 MED ORDER — ROCURONIUM BROMIDE 100 MG/10ML IV SOLN
INTRAVENOUS | Status: DC | PRN
  Administered 2018-06-13: 40 mg via INTRAVENOUS

## 2018-06-13 MED ORDER — FENTANYL CITRATE (PF) 100 MCG/2ML IJ SOLN
INTRAMUSCULAR | Status: DC | PRN
  Administered 2018-06-13: 50 ug via INTRAVENOUS
  Administered 2018-06-13: 100 ug via INTRAVENOUS
  Administered 2018-06-13 (×2): 50 ug via INTRAVENOUS

## 2018-06-13 MED ORDER — PROMETHAZINE HCL 25 MG/ML IJ SOLN: 6.2500 mg | INTRAMUSCULAR | Status: DC | PRN

## 2018-06-13 MED ORDER — LEVOFLOXACIN IN D5W 750 MG/150ML IV SOLN
750.0000 mg | Freq: Once | INTRAVENOUS | Status: AC
  Administered 2018-06-13: 750 mg via INTRAVENOUS
  Filled 2018-06-13: qty 150

## 2018-06-13 SURGICAL SUPPLY — 39 items
APPLIER CLIP 13 LRG OPEN (CLIP) ×3
BLADE SURG SZ10 CARB STEEL (BLADE) IMPLANT
CLIP APPLIE 13 LRG OPEN (CLIP) ×1 IMPLANT
CLOTH BEACON ORANGE TIMEOUT ST (SAFETY) ×3 IMPLANT
COVER LIGHT HANDLE STERIS (MISCELLANEOUS) ×6 IMPLANT
DECANTER SPIKE VIAL GLASS SM (MISCELLANEOUS) ×3 IMPLANT
DRAPE HALF SHEET 40X57 (DRAPES) ×3 IMPLANT
DRAPE STERI URO 9X17 APER PCH (DRAPES) ×3 IMPLANT
ELECT REM PT RETURN 9FT ADLT (ELECTROSURGICAL) ×3
ELECTRODE REM PT RTRN 9FT ADLT (ELECTROSURGICAL) ×1 IMPLANT
FORMALIN 10 PREFIL 480ML (MISCELLANEOUS) ×3 IMPLANT
GAUZE SPONGE 4X4 12PLY STRL (GAUZE/BANDAGES/DRESSINGS) ×3 IMPLANT
GLOVE BIO SURGEON STRL SZ7 (GLOVE) ×3 IMPLANT
GLOVE BIOGEL PI IND STRL 7.0 (GLOVE) ×2 IMPLANT
GLOVE BIOGEL PI IND STRL 8 (GLOVE) ×1 IMPLANT
GLOVE BIOGEL PI INDICATOR 7.0 (GLOVE) ×4
GLOVE BIOGEL PI INDICATOR 8 (GLOVE) ×2
GLOVE ECLIPSE 6.5 STRL STRAW (GLOVE) ×3 IMPLANT
GLOVE ECLIPSE 8.0 STRL XLNG CF (GLOVE) ×3 IMPLANT
GOWN STRL REUS W/TWL LRG LVL3 (GOWN DISPOSABLE) ×6 IMPLANT
GOWN STRL REUS W/TWL XL LVL3 (GOWN DISPOSABLE) ×3 IMPLANT
IV NS IRRIG 3000ML ARTHROMATIC (IV SOLUTION) ×3 IMPLANT
KIT TURNOVER CYSTO (KITS) ×3 IMPLANT
MANIFOLD NEPTUNE II (INSTRUMENTS) ×3 IMPLANT
NEEDLE HYPO 22GX1.5 SAFETY (NEEDLE) ×3 IMPLANT
NS IRRIG 1000ML POUR BTL (IV SOLUTION) ×3 IMPLANT
PACK PERI GYN (CUSTOM PROCEDURE TRAY) ×3 IMPLANT
PAD ARMBOARD 7.5X6 YLW CONV (MISCELLANEOUS) ×3 IMPLANT
SET BASIN LINEN APH (SET/KITS/TRAYS/PACK) ×3 IMPLANT
SUT MNCRL+ AB 3-0 CT1 36 (SUTURE) ×1 IMPLANT
SUT MON AB 3-0 SH 27 (SUTURE) IMPLANT
SUT MONOCRYL AB 3-0 CT1 36IN (SUTURE) ×2
SUT VIC AB 0 CT1 27 (SUTURE) ×6
SUT VIC AB 0 CT1 27XCR 8 STRN (SUTURE) ×3 IMPLANT
SYR CONTROL 10ML LL (SYRINGE) ×3 IMPLANT
TOWEL OR 17X26 4PK STRL BLUE (TOWEL DISPOSABLE) ×3 IMPLANT
TRAY FOLEY MTR SLVR 16FR STAT (SET/KITS/TRAYS/PACK) ×3 IMPLANT
VERSALIGHT (MISCELLANEOUS) ×3 IMPLANT
WATER STERILE IRR 1000ML POUR (IV SOLUTION) ×3 IMPLANT

## 2018-06-13 NOTE — Anesthesia Procedure Notes (Signed)
Procedure Name: Intubation Date/Time: 06/13/2018 8:27 AM Performed by: Vista Deck, CRNA Pre-anesthesia Checklist: Patient identified, Patient being monitored, Timeout performed, Emergency Drugs available and Suction available Patient Re-evaluated:Patient Re-evaluated prior to induction Oxygen Delivery Method: Circle System Utilized Preoxygenation: Pre-oxygenation with 100% oxygen Induction Type: IV induction Ventilation: Mask ventilation without difficulty Laryngoscope Size: Mac and 3 Grade View: Grade I Tube type: Oral Tube size: 7.0 mm Number of attempts: 1 Airway Equipment and Method: stylet and Oral airway Placement Confirmation: ETT inserted through vocal cords under direct vision,  positive ETCO2 and breath sounds checked- equal and bilateral Secured at: 21 cm Tube secured with: Tape Dental Injury: Teeth and Oropharynx as per pre-operative assessment

## 2018-06-13 NOTE — Anesthesia Postprocedure Evaluation (Signed)
Anesthesia Post Note  Patient: Nicole Bell  Procedure(s) Performed: HYSTERECTOMY VAGINAL (N/A )  Patient location during evaluation: PACU Anesthesia Type: General Level of consciousness: awake and alert and patient cooperative Pain management: satisfactory to patient Vital Signs Assessment: post-procedure vital signs reviewed and stable Respiratory status: spontaneous breathing Cardiovascular status: stable Postop Assessment: no apparent nausea or vomiting Anesthetic complications: no     Last Vitals:  Vitals:   06/13/18 1045 06/13/18 1100  BP: 110/66 103/70  Pulse: 79 72  Resp: (!) 9 15  Temp:    SpO2: 95% 99%    Last Pain:  Vitals:   06/13/18 1100  TempSrc:   PainSc: 3                  Kayce Chismar

## 2018-06-13 NOTE — Transfer of Care (Signed)
Immediate Anesthesia Transfer of Care Note  Patient: PANSEY PINHEIRO  Procedure(s) Performed: HYSTERECTOMY VAGINAL (N/A )  Patient Location: PACU  Anesthesia Type:General  Level of Consciousness: awake, alert  and patient cooperative  Airway & Oxygen Therapy: Patient Spontanous Breathing  Post-op Assessment: Report given to RN and Post -op Vital signs reviewed and stable  Post vital signs: Reviewed and stable  Last Vitals:  Vitals Value Taken Time  BP    Temp    Pulse 98 06/13/2018 10:25 AM  Resp 16 06/13/2018 10:25 AM  SpO2 100 % 06/13/2018 10:25 AM  Vitals shown include unvalidated device data.  Last Pain:  Vitals:   06/13/18 0717  TempSrc: Oral  PainSc: 0-No pain         Complications: No apparent anesthesia complications

## 2018-06-13 NOTE — Discharge Instructions (Signed)
Vaginal Hysterectomy, Care After °Refer to this sheet in the next few weeks. These instructions provide you with information about caring for yourself after your procedure. Your health care provider may also give you more specific instructions. Your treatment has been planned according to current medical practices, but problems sometimes occur. Call your health care provider if you have any problems or questions after your procedure. °What can I expect after the procedure? °After the procedure, it is common to have: °· Pain. °· Soreness and numbness in your incision areas. °· Vaginal bleeding and discharge. °· Constipation. °· Temporary problems emptying the bladder. °· Feelings of sadness or other emotions. ° °Follow these instructions at home: °Medicines °· Take over-the-counter and prescription medicines only as told by your health care provider. °· If you were prescribed an antibiotic medicine, take it as told by your health care provider. Do not stop taking the antibiotic even if you start to feel better. °· Do not drive or operate heavy machinery while taking prescription pain medicine. °Activity °· Return to your normal activities as told by your health care provider. Ask your health care provider what activities are safe for you. °· Get regular exercise as told by your health care provider. You may be told to take short walks every day and go farther each time. °· Do not lift anything that is heavier than 10 lb (4.5 kg). °General instructions ° °· Do not put anything in your vagina for 6 weeks after your surgery or as told by your health care provider. This includes tampons and douches. °· Do not have sex until your health care provider says you can. °· Do not take baths, swim, or use a hot tub until your health care provider approves. °· Drink enough fluid to keep your urine clear or pale yellow. °· Do not drive for 24 hours if you were given a sedative. °· Keep all follow-up visits as told by your health  care provider. This is important. °Contact a health care provider if: °· Your pain medicine is not helping. °· You have a fever. °· You have redness, swelling, or pain at your incision site. °· You have blood, pus, or a bad-smelling discharge from your vagina. °· You continue to have difficulty urinating. °Get help right away if: °· You have severe abdominal or back pain. °· You have heavy bleeding from your vagina. °· You have chest pain or shortness of breath. °This information is not intended to replace advice given to you by your health care provider. Make sure you discuss any questions you have with your health care provider. °Document Released: 12/28/2015 Document Revised: 02/11/2016 Document Reviewed: 09/20/2015 °Elsevier Interactive Patient Education © 2018 Elsevier Inc. ° °

## 2018-06-13 NOTE — Anesthesia Preprocedure Evaluation (Signed)
Anesthesia Evaluation  Patient identified by MRN, date of birth, ID band Patient awake    Reviewed: Allergy & Precautions, H&P , NPO status , Patient's Chart, lab work & pertinent test results, reviewed documented beta blocker date and time   Airway Mallampati: II  TM Distance: >3 FB Neck ROM: full    Dental no notable dental hx.    Pulmonary neg pulmonary ROS, former smoker,    Pulmonary exam normal breath sounds clear to auscultation       Cardiovascular Exercise Tolerance: Good negative cardio ROS   Rhythm:regular Rate:Normal     Neuro/Psych negative neurological ROS  negative psych ROS   GI/Hepatic negative GI ROS, Neg liver ROS,   Endo/Other  negative endocrine ROSHypothyroidism Hyperthyroidism   Renal/GU negative Renal ROS  negative genitourinary   Musculoskeletal   Abdominal   Peds  Hematology negative hematology ROS (+)   Anesthesia Other Findings Smoking cesation February, 2019 Dx hyperthyroid/[place]- revised DX to Hypothyroid- treated with synthroid.  States synthroid "allergy" is an error  Reproductive/Obstetrics negative OB ROS                             Anesthesia Physical Anesthesia Plan  ASA: II  Anesthesia Plan: General   Post-op Pain Management:    Induction:   PONV Risk Score and Plan:   Airway Management Planned:   Additional Equipment:   Intra-op Plan:   Post-operative Plan:   Informed Consent: I have reviewed the patients History and Physical, chart, labs and discussed the procedure including the risks, benefits and alternatives for the proposed anesthesia with the patient or authorized representative who has indicated his/her understanding and acceptance.   Dental Advisory Given  Plan Discussed with: CRNA and Anesthesiologist  Anesthesia Plan Comments:         Anesthesia Quick Evaluation

## 2018-06-13 NOTE — Interval H&P Note (Signed)
History and Physical Interval Note:  06/13/2018 8:13 AM  Nicole Bell  has presented today for surgery, with the diagnosis of High Grade Squamous Cervical Dysplasia  The various methods of treatment have been discussed with the patient and family. After consideration of risks, benefits and other options for treatment, the patient has consented to  Procedure(s): HYSTERECTOMY VAGINAL (N/A) as a surgical intervention .  The patient's history has been reviewed, patient examined, no change in status, stable for surgery.  I have reviewed the patient's chart and labs.  Questions were answered to the patient's satisfaction.     Lazaro Arms

## 2018-06-13 NOTE — H&P (Signed)
Preoperative History and Physical  Nicole Bell is a 37 y.o. [id number] with No LMP recorded. Patient has had an injection. 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  Pt wants definitive surgical management for recureent HSIL She has completed her family  PMH:        Past Medical History:  Diagnosis Date  . Abnormal Pap smear   . 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 HSV-2 (herpes simplex virus 2) infection   . Hyperthyroidism 12/28/2009  . IBS (irritable bowel syndrome)   . Nicotine addiction 08/05/2013  . Vaginal Pap smear, abnormal     PSH:          Past Surgical History:  Procedure Laterality Date  . CERVICAL CONIZATION W/BX  01/25/2012   Procedure: CONIZATION CERVIX WITH BIOPSY;  Surgeon: Florian Buff, MD;  Location: [facility];  Service: Gynecology;  Laterality: N/A;  Laser conization of cervix  . CHOLECYSTECTOMY  2010   [place]  . RECTOVAGINAL FISTULA CLOSURE  2004  . WISDOM TOOTH EXTRACTION      POb/GynH:              OB History    Gravida  3   Para  2   Term      Preterm      AB  1   Living  2     SAB  1   TAB      Ectopic      Multiple      Live Births  2           SH:   Social History        Tobacco Use  . Smoking status: Former Smoker    Packs/day: 0.50    Years: 15.00    Pack years: 7.50    Types: Cigarettes  . Smokeless tobacco: Never Used  Substance Use Topics  . Alcohol use: No  . Drug use: No    FH:         Family History  Problem Relation Age of Onset  . Cancer Father        stomach   . COPD Father   . Cancer Maternal Grandmother        Breast   . Diabetes Mother   . COPD Mother   . Fibromyalgia Mother   . Emphysema Mother   . Asthma Daughter   . Diabetes Other    . Hypertension Other   . Coronary artery disease Other   . Anesthesia problems Neg Hx   . Hypotension Neg Hx   . Malignant hyperthermia Neg Hx   . Pseudochol deficiency Neg Hx      Allergies: No Known Allergies  Medications:       Current Outpatient Medications:  .  Aspirin-Acetaminophen-Caffeine (EXCEDRIN MIGRAINE PO), Take by mouth as needed., Disp: , Rfl:  .  Ciprofloxacin HCl 0.2 % otic solution, , Disp: , Rfl:  .  levothyroxine (SYNTHROID, LEVOTHROID) 75 MCG tablet, Take 1 tablet (75 mcg total) by mouth daily before breakfast., Disp: 30 tablet, Rfl: 1 .  medroxyPROGESTERone Acetate 150 MG/ML SUSY, INJECT IN PHYSICIANS OFFICE., Disp: 1 mL, Rfl: 4  Review of Systems:   Review of Systems  Constitutional: Negative for fever, chills, weight loss, malaise/fatigue and diaphoresis.  HENT: Negative for hearing loss, ear pain, nosebleeds, congestion, sore throat, neck pain, tinnitus and ear discharge.   Eyes: Negative for blurred vision, double vision, photophobia, pain, discharge and redness.  Respiratory: Negative for cough, hemoptysis, sputum production, shortness of breath, wheezing and stridor.   Cardiovascular: Negative for chest pain, palpitations, orthopnea, claudication, leg swelling and PND.  Gastrointestinal: Positive for abdominal pain. Negative for heartburn, nausea, vomiting, diarrhea, constipation, blood in stool and melena.  Genitourinary: Negative for dysuria, urgency, frequency, hematuria and flank pain.  Musculoskeletal: Negative for myalgias, back pain, joint pain and falls.  Skin: Negative for itching and rash.  Neurological: Negative for dizziness, tingling, tremors, sensory change, speech change, focal weakness, seizures, loss of consciousness, weakness and headaches.  Endo/Heme/Allergies: Negative for environmental allergies and polydipsia. Does not bruise/bleed easily.  Psychiatric/Behavioral: Negative for depression, suicidal ideas, hallucinations,  memory loss and substance abuse. The patient is not nervous/anxious and does not have insomnia.      PHYSICAL EXAM:  Blood pressure 126/86, pulse 76, weight 166 lb (75.3 kg).    Vitals reviewed. Constitutional: She is oriented to person, place, and time. She appears well-developed and well-nourished.  HENT:  Head: Normocephalic and atraumatic.  Right Ear: External ear normal.  Left Ear: External ear normal.  Nose: Nose normal.  Mouth/Throat: Oropharynx is clear and moist.  Eyes: Conjunctivae and EOM are normal. Pupils are equal, round, and reactive to light. Right eye exhibits no discharge. Left eye exhibits no discharge. No scleral icterus.  Neck: Normal range of motion. Neck supple. No tracheal deviation present. No thyromegaly present.  Cardiovascular: Normal rate, regular rhythm, normal heart sounds and intact distal pulses.  Exam reveals no gallop and no friction rub.   No murmur heard. Respiratory: Effort normal and breath sounds normal. No respiratory distress. She has no wheezes. She has no rales. She exhibits no tenderness.  GI: Soft. Bowel sounds are normal. She exhibits no distension and no mass. There is tenderness. There is no rebound and no guarding.  Genitourinary:       Vulva is normal without lesions Vagina is pink moist without discharge Cervix normal in appearance  Uterus is normal size, contour, position, consistency, mobility, non-tender Adnexa is negative with normal sized ovaries by sonogram  Musculoskeletal: Normal range of motion. She exhibits no edema and no tenderness.  Neurological: She is alert and oriented to person, place, and time. She has normal reflexes. She displays normal reflexes. No cranial nerve deficit. She exhibits normal muscle tone. Coordination normal.  Skin: Skin is warm and dry. No rash noted. No erythema. No pallor.  Psychiatric: She has a normal mood and affect. Her behavior is normal. Judgment and thought content normal.     Labs: Recent Results (from the past 2160 hour(s))  Cytology - PAP     Status: Abnormal   Collection Time: 04/16/18 12:00 AM  Result Value Ref Range   Adequacy      Satisfactory for evaluation  endocervical/transformation zone component ABSENT.   Diagnosis      NEGATIVE FOR INTRAEPITHELIAL LESIONS OR MALIGNANCY.   Chlamydia Negative     Comment: Normal Reference Range - Negative   Neisseria gonorrhea Negative     Comment: Normal Reference Range - Negative   HPV DETECTED (A)     Comment: Normal Reference Range - NOT Detected   Material Submitted CervicoVaginal Pap [ThinPrep Imaged]    CYTOLOGY - PAP PAP RESULT   CBC     Status: Abnormal   Collection  Time: 04/16/18  9:19 AM  Result Value Ref Range   WBC 5.0 3.4 - 10.8 x10E3/uL   RBC 4.68 3.77 - 5.28 x10E6/uL   Hemoglobin 14.4 11.1 - 15.9 g/dL   Hematocrit 40.0 34.0 - 46.6 %   MCV 86 79 - 97 fL   MCH 30.8 26.6 - 33.0 pg   MCHC 36.0 (H) 31.5 - 35.7 g/dL   RDW 13.3 12.3 - 15.4 %   Platelets 186 150 - 450 x10E3/uL  Comprehensive metabolic panel     Status: Abnormal   Collection Time: 04/16/18  9:19 AM  Result Value Ref Range   Glucose 93 65 - 99 mg/dL   BUN 8 6 - 20 mg/dL   Creatinine, Ser 1.00 0.57 - 1.00 mg/dL   GFR calc non Af Amer 73 >59 mL/min/1.73   GFR calc Af Amer 84 >59 mL/min/1.73   BUN/Creatinine Ratio 8 (L) 9 - 23   Sodium 141 134 - 144 mmol/L   Potassium 4.4 3.5 - 5.2 mmol/L   Chloride 105 96 - 106 mmol/L   CO2 22 20 - 29 mmol/L   Calcium 9.4 8.7 - 10.2 mg/dL   Total Protein 6.8 6.0 - 8.5 g/dL   Albumin 4.5 3.5 - 5.5 g/dL   Globulin, Total 2.3 1.5 - 4.5 g/dL   Albumin/Globulin Ratio 2.0 1.2 - 2.2   Bilirubin Total 0.5 0.0 - 1.2 mg/dL   Alkaline Phosphatase 76 39 - 117 IU/L   AST 16 0 - 40 IU/L   ALT 14 0 - 32 IU/L  TSH     Status: Abnormal   Collection Time: 04/16/18  9:19 AM  Result Value Ref Range   TSH 7.060 (H) 0.450 - 4.500 uIU/mL  Lipid panel     Status: Abnormal   Collection Time: 04/16/18   9:19 AM  Result Value Ref Range   Cholesterol, Total 199 100 - 199 mg/dL   Triglycerides 85 0 - 149 mg/dL   HDL 43 >39 mg/dL   VLDL Cholesterol Cal 17 5 - 40 mg/dL   LDL Calculated 139 (H) 0 - 99 mg/dL   Chol/HDL Ratio 4.6 (H) 0.0 - 4.4 ratio    Comment:                                   T. Chol/HDL Ratio                                             Men  Women                               1/2 Avg.Risk  3.4    3.3                                   Avg.Risk  5.0    4.4                                2X Avg.Risk  9.6    7.1  3X Avg.Risk 23.4   11.0   T4, free     Status: None   Collection Time: 04/16/18  9:19 AM  Result Value Ref Range   Free T4 1.22 0.82 - 1.77 ng/dL  POCT urine pregnancy     Status: None   Collection Time: 04/27/18 10:31 AM  Result Value Ref Range   Preg Test, Ur Negative Negative  POCT urine pregnancy     Status: None   Collection Time: 05/17/18  1:39 PM  Result Value Ref Range   Preg Test, Ur Negative Negative  CBC     Status: None   Collection Time: 06/08/18  8:09 AM  Result Value Ref Range   WBC 4.7 4.0 - 10.5 K/uL   RBC 4.39 3.87 - 5.11 MIL/uL   Hemoglobin 13.1 12.0 - 15.0 g/dL   HCT 38.5 36.0 - 46.0 %   MCV 87.7 78.0 - 100.0 fL   MCH 29.8 26.0 - 34.0 pg   MCHC 34.0 30.0 - 36.0 g/dL   RDW 12.6 11.5 - 15.5 %   Platelets 173 150 - 400 K/uL    Comment: Performed at [facility], [address], [place], [place] [address]  Comprehensive metabolic panel     Status: None   Collection Time: 06/08/18  8:09 AM  Result Value Ref Range   Sodium 140 135 - 145 mmol/L   Potassium 3.5 3.5 - 5.1 mmol/L   Chloride 108 98 - 111 mmol/L   CO2 25 22 - 32 mmol/L   Glucose, Bld 96 70 - 99 mg/dL   BUN 9 6 - 20 mg/dL   Creatinine, Ser 0.84 0.44 - 1.00 mg/dL   Calcium 9.0 8.9 - 10.3 mg/dL   Total Protein 7.2 6.5 - 8.1 g/dL   Albumin 4.3 3.5 - 5.0 g/dL   AST 16 15 - 41 U/L   ALT 15 0 - 44 U/L   Alkaline Phosphatase 72 38 - 126 U/L   Total  Bilirubin 0.9 0.3 - 1.2 mg/dL   GFR calc non Af Amer >60 >60 mL/min   GFR calc Af Amer >60 >60 mL/min    Comment: (NOTE) The eGFR has been calculated using the CKD EPI equation. This calculation has not been validated in all clinical situations. eGFR's persistently <60 mL/min signify possible Chronic Kidney Disease.    Anion gap 7 5 - 15    Comment: Performed at [facility], [address] [place], [place] [address]  hCG, quantitative, pregnancy     Status: None   Collection Time: 06/08/18  8:09 AM  Result Value Ref Range   hCG, Beta Chain, Quant, S <1 <5 mIU/mL    Comment:          GEST. AGE      CONC.  (mIU/mL)   <=1 WEEK        5 - 50     2 WEEKS       50 - 500     3 WEEKS       100 - 10,000     4 WEEKS     1,000 - 30,000     5 WEEKS     3,500 - 115,000   6-8 WEEKS     12,000 - 270,000    12 WEEKS     15,000 - 220,000        FEMALE AND NON-PREGNANT FEMALE:     LESS THAN 5 mIU/mL Performed at [facility], [address] [place],  [place] [address]   Rapid HIV screen (HIV 1/2 Ab+Ag)     Status: None   Collection Time: 06/08/18  8:09 AM  Result Value Ref Range   HIV-1 P24 Antigen - HIV24 NON REACTIVE NON REACTIVE   HIV 1/2 Antibodies NON REACTIVE NON REACTIVE   Interpretation (HIV Ag Ab)      A non reactive test result means that HIV 1 or HIV 2 antibodies and HIV 1 p24 antigen were not detected in the specimen.    Comment: NON REACTIVE Performed at [facility], [address], [place], [place] [address]   Type and screen     Status: None   Collection Time: 06/08/18  8:09 AM  Result Value Ref Range   ABO/RH(D) A POS    Antibody Screen NEG    Sample Expiration      06/22/2018 Performed at [facility], [address], [place], [place] [address]   Urinalysis, Routine w reflex microscopic     Status: Abnormal   Collection Time: 06/08/18  8:09 AM  Result Value Ref Range   Color, Urine YELLOW YELLOW   APPearance HAZY (A) CLEAR   Specific Gravity, Urine 1.020 1.005 - 1.030    pH 5.0 5.0 - 8.0   Glucose, UA NEGATIVE NEGATIVE mg/dL   Hgb urine dipstick NEGATIVE NEGATIVE   Bilirubin Urine NEGATIVE NEGATIVE   Ketones, ur NEGATIVE NEGATIVE mg/dL   Protein, ur NEGATIVE NEGATIVE mg/dL   Nitrite NEGATIVE NEGATIVE   Leukocytes, UA NEGATIVE NEGATIVE    Comment: Performed at [facility], [address], [place], [place] [address]                     Negative Negative    EKG: No orders found for this or any previous visit.  Imaging Studies: Imaging Results  No results found.      Assessment:  HSIL of the cervix, recurrent Desires definitive surgical management      Patient Active Problem List   Diagnosis Date Noted  . Elevated cholesterol 04/16/2018  . Hypothyroidism 04/16/2018  . Surveillance for Depo-Provera contraception 04/16/2018  . Hypothyroid 04/17/2017  . Cervical high risk human papillomavirus (HPV) DNA test positive 04/17/2017  . Encounter for surveillance of injectable contraceptive 04/12/2017  . Encounter for gynecological examination with Papanicolaou smear of cervix 04/12/2017  . Nicotine addiction 08/05/2013  . IBS (irritable bowel syndrome) 08/05/2013  . Contraceptive management 08/05/2013    Plan: Vaginal hysterectomy with removal of both Fallopian tubes, if possible, preserve ovaries 06/13/2018  Mertie Clause Eure 05/24/2018 1:13 PM

## 2018-06-13 NOTE — Progress Notes (Signed)
Patient discharged home.  IV removed - WNL.  Reviewed AVS and medications - verbalizes understanding.  No questions at this time.  Assisted off unit in NAD.

## 2018-06-13 NOTE — Progress Notes (Signed)
Patients foley removed - already voided and ambulating halls without difficulty.  Tolerating diet.  Minimal pain controlled with PO meds

## 2018-06-13 NOTE — Op Note (Signed)
Preoperative diagnosis:  1.  HSIL of the cervix                                         2.  Recurrent HSIL, s/p laser conization 2013                                         3.  Desires definitive therapy                                         4.    Postoperative diagnosis:  Same as above   Procedure:  Vaginal hysterectomy  Surgeon:  Lazaro Arms MD  Anesthesia:  General Endotracheal  Findings:  Shortened cervix due to previous conization  Intraoperative findings were normal  Description of operation:  The patient was taken from the preoperative area to the operating room in stable condition. She was placed in the supine position and underwent a GET. Once an adequate level of anesthesia was attained she was placed in the dorsal lithotomy position. Patient was prepped and draped in the usual sterile fashion and a Foley catheter was placed.  A weighted speculum was placed and the cervix was grasped with thyroid tenaculums both anteriorly and posteriorly.  0.5% Marcaine with 1/200K epinephrine was injected in a circumferential fashion about the cervix and the electrocautery unit was used to incise the vagina and push at all cervix.  The posterior cul-de-sac was then entered sharply without difficulty.  The uterosacral ligaments were clamped cut and inspection suture ligated and held.  The cardinal ligaments were then clamped cut transfixion suture ligated and cut. The anterior peritoneum was identified the anterior cul-de-sac was entered sharply without difficulty. The anterior and posterior leaves of the broad ligament were plicated and the uterine vessels were clamped cut and suture ligated. Serial pedicles were taken of the fundus with each pedicle being clamped cut and suture ligated. The utero-ovarian ligaments were crossclamped the uterus was removed and both pedicles were transfixion suture ligated. There was good hemostasis of all the pedicles. The peritoneum was then closed in a pursestring  fashion using 3-0 Vicryl. The anterior posterior vagina was closed in interrupted fashion with good resultant hemostasis. Our closure the lower pelvis and vagina were irrigated vigorously.  The sponge needle and instrument counts were correct x 3.  Total blood loss for the procedure was 150 cc.  The patient received 2 g of Ancef and 30 mg of Toradol IV preoperatively prophylactically.  She was taken to the recovery room in good stable condition awake alert doing well.  Lazaro Arms 06/13/2018, 10:41 AM

## 2018-06-13 NOTE — Discharge Summary (Signed)
Physician Discharge Summary  Patient ID: Nicole Bell MRN: [id number] DOB/AGE: 04-02-1981 37 y.o.  Admit date: 06/13/2018 Discharge date: 06/13/2018  Admission Diagnoses:recurrent HSIL  Discharge Diagnoses:  Active Problems:   S/P vaginal hysterectomy   Discharged Condition: good  Hospital Course: normal  Consults:   Significant Diagnostic Studies: labs:  Results for orders placed or performed during the hospital encounter of 06/13/18 (from the past 24 hour(s))  CBC     Status: Abnormal   Collection Time: 06/13/18  4:38 PM  Result Value Ref Range   WBC 6.5 4.0 - 10.5 K/uL   RBC 3.96 3.87 - 5.11 MIL/uL   Hemoglobin 11.9 (L) 12.0 - 15.0 g/dL   HCT 78.2 (L) 95.6 - 21.3 %   MCV 87.1 78.0 - 100.0 fL   MCH 30.1 26.0 - 34.0 pg   MCHC 34.5 30.0 - 36.0 g/dL   RDW 08.6 57.8 - 46.9 %   Platelets 163 150 - 400 K/uL  Basic metabolic panel     Status: Abnormal   Collection Time: 06/13/18  4:38 PM  Result Value Ref Range   Sodium 135 135 - 145 mmol/L   Potassium 3.9 3.5 - 5.1 mmol/L   Chloride 107 98 - 111 mmol/L   CO2 23 22 - 32 mmol/L   Glucose, Bld 99 70 - 99 mg/dL   BUN 7 6 - 20 mg/dL   Creatinine, Ser 6.29 0.44 - 1.00 mg/dL   Calcium 8.6 (L) 8.9 - 10.3 mg/dL   GFR calc non Af Amer >60 >60 mL/min   GFR calc Af Amer >60 >60 mL/min   Anion gap 5 5 - 15    Treatments: surgery: TVH  Discharge Exam: Blood pressure 104/67, pulse 67, temperature 97.6 F (36.4 C), resp. rate 16, height 5\' 2"  (1.575 m), weight 76.2 kg, SpO2 100 %. General appearance: alert, cooperative and no distress GI: soft, non-tender; bowel sounds normal; no masses,  no organomegaly  Disposition: Discharge disposition: 01-Home or Self Care       Discharge Instructions    Call MD for:  persistant nausea and vomiting   Complete by:  As directed    Call MD for:  severe uncontrolled pain   Complete by:  As directed    Call MD for:  temperature >100.4   Complete by:  As directed    Diet - low sodium  heart healthy   Complete by:  As directed    Driving Restrictions   Complete by:  As directed    No driving for 1 week   Increase activity slowly   Complete by:  As directed    Lifting restrictions   Complete by:  As directed    Do not lift more than 10 pounds for 4 weeks(including the chihuahua)   No wound care   Complete by:  As directed    Sexual Activity Restrictions   Complete by:  As directed    You gotta be kidding!!!     Allergies as of 06/13/2018   No Known Allergies     Medication List    TAKE these medications   ciprofloxacin 500 MG tablet Commonly known as:  CIPRO Take 1 tablet (500 mg total) by mouth 2 (two) times daily.   EXCEDRIN MIGRAINE PO Take 1 tablet by mouth daily as needed (headache).   fluconazole 100 MG tablet Commonly known as:  DIFLUCAN Take 1 tablet (100 mg total) by mouth daily.   ketorolac 10 MG tablet Commonly known  as:  TORADOL Take 1 tablet (10 mg total) by mouth every 8 (eight) hours as needed.   levothyroxine 75 MCG tablet Commonly known as:  SYNTHROID, LEVOTHROID Take 1 tablet (75 mcg total) by mouth daily before breakfast.   ondansetron 8 MG tablet Commonly known as:  ZOFRAN Take 1 tablet (8 mg total) by mouth every 6 (six) hours as needed for nausea.   oxyCODONE-acetaminophen 5-325 MG tablet Commonly known as:  PERCOCET/ROXICET Take 2 tablets by mouth every 4 (four) hours as needed for severe pain ((when tolerating fluids)).      Follow-up Information    Lazaro ArmsEure, Luther H, MD Follow up on 06/21/2018.   Specialties:  Obstetrics and Gynecology, Radiology Why:  post op visit Contact information: [address] [place] [phone]           Signed: Lazaro ArmsLuther H Eure 06/13/2018, 6:27 PM

## 2018-06-14 ENCOUNTER — Encounter: Payer: No Typology Code available for payment source | Admitting: Obstetrics & Gynecology

## 2018-06-14 ENCOUNTER — Encounter (HOSPITAL_COMMUNITY): Payer: Self-pay | Admitting: Obstetrics & Gynecology

## 2018-06-19 LAB — TYPE AND SCREEN
ABO/RH(D): A POS
Antibody Screen: NEGATIVE

## 2018-06-21 ENCOUNTER — Encounter: Payer: Self-pay | Admitting: Obstetrics & Gynecology

## 2018-06-21 ENCOUNTER — Ambulatory Visit (INDEPENDENT_AMBULATORY_CARE_PROVIDER_SITE_OTHER): Payer: No Typology Code available for payment source | Admitting: Obstetrics & Gynecology

## 2018-06-21 VITALS — BP 122/88 | HR 101 | Ht 62.0 in | Wt 164.5 lb

## 2018-06-21 DIAGNOSIS — Z9889 Other specified postprocedural states: Secondary | ICD-10-CM

## 2018-06-21 NOTE — Progress Notes (Signed)
  HPI: Patient returns for routine postoperative follow-up having undergone 9TVHon 06/13/2018.  The patient's immediate postoperative recovery has been unremarkable. Since hospital discharge the patient reports no problems.   Current Outpatient Medications: Aspirin-Acetaminophen-Caffeine (EXCEDRIN MIGRAINE PO), Take 1 tablet by mouth daily as needed (headache). , Disp: , Rfl:  fluconazole (DIFLUCAN) 100 MG tablet, Take 1 tablet (100 mg total) by mouth daily., Disp: 7 tablet, Rfl: 0 ibuprofen (ADVIL,MOTRIN) 200 MG tablet, Take 400 mg by mouth as needed., Disp: , Rfl:  levothyroxine (SYNTHROID, LEVOTHROID) 75 MCG tablet, Take 1 tablet (75 mcg total) by mouth daily before breakfast., Disp: 30 tablet, Rfl: 1  No current facility-administered medications for this visit.     Blood pressure 122/88, pulse (!) 101, height 5\' 2"  (1.575 m), weight 164 lb 8 oz (74.6 kg).  Physical Exam: Cuff intact healing all sutures in place  Diagnostic Tests:   Pathology: HSIL/CIS no invasion  Impression: S/p TVH for recurrent HSIL  Plan:   Follow up: 4  weeks  Lazaro Arms, MD

## 2018-07-09 ENCOUNTER — Other Ambulatory Visit: Payer: Self-pay | Admitting: Adult Health

## 2018-07-09 DIAGNOSIS — E039 Hypothyroidism, unspecified: Secondary | ICD-10-CM

## 2018-07-09 NOTE — Progress Notes (Signed)
Order placed for TSH and free T4.

## 2018-07-11 ENCOUNTER — Telehealth: Payer: Self-pay | Admitting: Adult Health

## 2018-07-11 LAB — TSH: TSH: 3.79 u[IU]/mL (ref 0.450–4.500)

## 2018-07-11 LAB — T4, FREE: FREE T4: 1.42 ng/dL (ref 0.82–1.77)

## 2018-07-11 MED ORDER — LEVOTHYROXINE SODIUM 75 MCG PO TABS: 75.0000 ug | ORAL_TABLET | Freq: Every day | ORAL | 2 refills | Status: DC

## 2018-07-11 MED FILL — LEVOTHYROXINE 75 MCG TABLET: 75 | 90 days supply | Qty: 90 | Fill #0

## 2018-07-11 NOTE — Telephone Encounter (Signed)
Pt aware that thyroid is normal, and she is feel good right now, will refill at current dose, bu if when back at work feels sluggish let me know

## 2018-07-19 ENCOUNTER — Encounter: Payer: Self-pay | Admitting: Obstetrics & Gynecology

## 2018-07-19 ENCOUNTER — Other Ambulatory Visit: Payer: Self-pay

## 2018-07-19 ENCOUNTER — Ambulatory Visit (INDEPENDENT_AMBULATORY_CARE_PROVIDER_SITE_OTHER): Payer: No Typology Code available for payment source | Admitting: Obstetrics & Gynecology

## 2018-07-19 VITALS — BP 114/80 | HR 74 | Ht 62.0 in | Wt 171.0 lb

## 2018-07-19 DIAGNOSIS — Z9889 Other specified postprocedural states: Secondary | ICD-10-CM

## 2018-07-19 DIAGNOSIS — Z90710 Acquired absence of both cervix and uterus: Secondary | ICD-10-CM

## 2018-07-19 NOTE — Progress Notes (Signed)
  HPI: Patient returns for routine postoperative follow-up having undergone TVH  on 06/13/2018.  The patient's immediate postoperative recovery has been unremarkable. Since hospital discharge the patient reports no problems.   Current Outpatient Medications: Aspirin-Acetaminophen-Caffeine (EXCEDRIN MIGRAINE PO), Take 1 tablet by mouth daily as needed (headache). , Disp: , Rfl:  ibuprofen (ADVIL,MOTRIN) 200 MG tablet, Take 400 mg by mouth as needed., Disp: , Rfl:  levothyroxine (SYNTHROID, LEVOTHROID) 75 MCG tablet, Take 1 tablet (75 mcg total) by mouth daily before breakfast., Disp: 90 tablet, Rfl: 2  No current facility-administered medications for this visit.     Blood pressure 114/80, pulse 74, height 5\' 2"  (1.575 m), weight 171 lb (77.6 kg).  Physical Exam: Abdomen is soft nontender Vagina intact healing well not ready for sex yet  Diagnostic Tests:   Pathology: CIS  Impression: S/p TVH normal post op course  Plan: Sex in 3 weeks  Follow up: 1 year    Lazaro Arms, MD

## 2018-11-02 MED FILL — LEVOTHYROXINE 75 MCG TABLET: 75 | 90 days supply | Qty: 90 | Fill #1 | Status: TO

## 2018-11-27 ENCOUNTER — Telehealth: Payer: Self-pay | Admitting: Pulmonary Disease

## 2018-11-27 NOTE — Telephone Encounter (Signed)
Sure we'll make room

## 2018-11-27 NOTE — Telephone Encounter (Signed)
Front informed and will contact patient

## 2018-11-27 NOTE — Telephone Encounter (Signed)
Shandrell's chaldern are current patient's with our office, Legina states Dr.Steve advised her that he would see her as a new patient, advise.

## 2018-11-27 NOTE — Telephone Encounter (Signed)
Please advise. Thank you

## 2018-11-28 ENCOUNTER — Other Ambulatory Visit: Payer: Self-pay

## 2018-11-28 ENCOUNTER — Ambulatory Visit (INDEPENDENT_AMBULATORY_CARE_PROVIDER_SITE_OTHER): Payer: No Typology Code available for payment source | Admitting: Family Medicine

## 2018-11-28 ENCOUNTER — Ambulatory Visit: Payer: No Typology Code available for payment source | Admitting: Family Medicine

## 2018-11-28 ENCOUNTER — Encounter: Payer: Self-pay | Admitting: Family Medicine

## 2018-11-28 VITALS — BP 112/68 | Temp 99.2°F | Ht 62.0 in | Wt 169.0 lb

## 2018-11-28 DIAGNOSIS — J111 Influenza due to unidentified influenza virus with other respiratory manifestations: Secondary | ICD-10-CM

## 2018-11-28 NOTE — Progress Notes (Signed)
   Subjective:    Patient ID: Nicole Bell, female    DOB: 1981-02-25, 38 y.o.   MRN: [id number]  Sinusitis  This is a new problem. Episode onset: 3 days. Associated symptoms include congestion, coughing, headaches and a sore throat. (Fever) Treatments tried: 3rd day of tamiflu and ibuprofen.   Stared feeling bad Sunday  Felt cough and body ache  Productive thick    Greenish, non smoker     No hx of  w  Bad aching   Throat hurt really  Bad  tmax 100.4  Energy level dim    Appetite  dimished       Review of Systems  HENT: Positive for congestion and sore throat.   Respiratory: Positive for cough.   Neurological: Positive for headaches.       Objective:   Physical Exam  Alert vitals reviewed, moderate malaise. Hydration good. Positive nasal congestion lungs no crackles or wheezes, no tachypnea, intermittent bronchial cough during exam heart regular rate and rhythm.       Assessment & Plan:  Impression influenza discussed at length. Ashby Dawes of illness and potential sequela discussed. Plan Tamiflu prescribed if indicated and timing appropriate. Symptom care discussed. Warning signs discussed. WSL Of note patient already started on Tamiflu 2 days ago by her prior primary care clinician.  Encouraged to maintain

## 2019-01-19 MED FILL — LEVOTHYROXINE 75 MCG TABLET: 75 | 90 days supply | Qty: 90 | Fill #0

## 2019-04-11 ENCOUNTER — Other Ambulatory Visit: Payer: Self-pay | Admitting: Adult Health

## 2019-04-11 MED FILL — LEVOTHYROXINE 75 MCG TABLET: 75 | 90 days supply | Qty: 90 | Fill #0

## 2019-08-05 ENCOUNTER — Other Ambulatory Visit: Payer: Self-pay | Admitting: Adult Health

## 2019-08-05 MED FILL — LEVOTHYROXINE 75 MCG TABLET: 75 | 90 days supply | Qty: 90 | Fill #0

## 2019-10-21 ENCOUNTER — Encounter: Payer: Self-pay | Admitting: Family Medicine

## 2019-11-14 ENCOUNTER — Other Ambulatory Visit: Payer: No Typology Code available for payment source

## 2019-11-14 ENCOUNTER — Other Ambulatory Visit: Payer: Self-pay

## 2019-11-14 ENCOUNTER — Other Ambulatory Visit: Payer: Self-pay | Admitting: Adult Health

## 2019-11-14 DIAGNOSIS — E039 Hypothyroidism, unspecified: Secondary | ICD-10-CM

## 2019-11-14 MED FILL — LEVOTHYROXINE SODIUM 75 MCG: 75 | 90 days supply | Qty: 90 | Fill #0

## 2019-11-15 LAB — T4, FREE: Free T4: 1.23 ng/dL (ref 0.82–1.77)

## 2019-11-15 LAB — TSH: TSH: 9.54 u[IU]/mL — ABNORMAL HIGH (ref 0.450–4.500)

## 2019-11-18 ENCOUNTER — Telehealth: Payer: Self-pay | Admitting: *Deleted

## 2019-11-18 ENCOUNTER — Encounter: Payer: Self-pay | Admitting: Family Medicine

## 2019-11-18 MED ORDER — LEVOTHYROXINE SODIUM 25 MCG PO TABS: ORAL_TABLET | ORAL | 0 refills | Status: DC

## 2019-11-18 MED FILL — LEVOTHYROXINE SODIUM 25 MCG: 25 | 60 days supply | Qty: 60 | Fill #0

## 2019-11-18 NOTE — Telephone Encounter (Signed)
Merlyn Albert, MD     [facility] Asher Muir, thanks for showing your trust reaching out to me, but is is really, really, really (three reallys!) important for the prescribing clinician to be the one who reads the labs, makes the prescription decisions, and communicates that with the patient--otherwise things fall through the cracks and get all jumbled up when no one person is in charge (the old tale about too many cooks for one soup!). You will need to work with Victorino Dike

## 2019-11-18 NOTE — Telephone Encounter (Signed)
TSH elevated will increase synthroid to 100 mcg, has 75 at home will rd 25 mcg, recheck TSH and free T4 in 8 weeks

## 2019-11-18 NOTE — Addendum Note (Signed)
Addended by: Cyril Mourning A on: 11/18/2019 05:23 PM   Modules accepted: Orders

## 2019-11-18 NOTE — Telephone Encounter (Signed)
Patient called wanting to speak with Nicole Bell about her TSH results.  Says her [name] is not working.

## 2019-11-19 ENCOUNTER — Telehealth: Payer: Self-pay | Admitting: Adult Health

## 2019-11-19 NOTE — Telephone Encounter (Signed)
Mychart message sent.

## 2020-01-17 ENCOUNTER — Other Ambulatory Visit: Payer: Self-pay

## 2020-01-17 ENCOUNTER — Other Ambulatory Visit: Payer: No Typology Code available for payment source

## 2020-01-17 ENCOUNTER — Other Ambulatory Visit: Payer: Self-pay | Admitting: Adult Health

## 2020-01-17 DIAGNOSIS — E039 Hypothyroidism, unspecified: Secondary | ICD-10-CM

## 2020-01-17 NOTE — Progress Notes (Signed)
Ck TSH and free T4 today

## 2020-01-18 LAB — TSH: TSH: 0.792 u[IU]/mL (ref 0.450–4.500)

## 2020-01-18 LAB — T4, FREE: Free T4: 1.4 ng/dL (ref 0.82–1.77)

## 2020-01-20 ENCOUNTER — Other Ambulatory Visit: Payer: Self-pay | Admitting: Adult Health

## 2020-01-20 MED FILL — LEVOTHYROXINE SODIUM 25 MCG: 25 | 90 days supply | Qty: 90 | Fill #0

## 2020-02-14 ENCOUNTER — Other Ambulatory Visit: Payer: Self-pay | Admitting: Adult Health

## 2020-02-14 ENCOUNTER — Telehealth: Payer: Self-pay | Admitting: Adult Health

## 2020-02-14 MED FILL — LEVOTHYROXINE SODIUM 75 MCG: 75 | 60 days supply | Qty: 60 | Fill #0

## 2020-02-14 NOTE — Telephone Encounter (Signed)
Sent refill request to [facility].

## 2020-02-14 NOTE — Telephone Encounter (Signed)
Patient called stating that her pharmacy sent over a fax this morning regarding a refill of her medication. I did let patient know that Victorino Dike is not in the office today, pt would like for another provider to sign off on the medication, because she has run out

## 2020-03-16 ENCOUNTER — Other Ambulatory Visit: Payer: Self-pay | Admitting: *Deleted

## 2020-03-16 ENCOUNTER — Encounter: Payer: Self-pay | Admitting: Family Medicine

## 2020-03-16 MED ORDER — FLUCONAZOLE 150 MG PO TABS: ORAL_TABLET | ORAL | 0 refills | Status: DC

## 2020-04-14 ENCOUNTER — Other Ambulatory Visit: Payer: Self-pay | Admitting: Adult Health

## 2020-04-14 ENCOUNTER — Telehealth: Payer: Self-pay | Admitting: Adult Health

## 2020-04-14 MED ORDER — LEVOTHYROXINE SODIUM 100 MCG PO TABS: 100.0000 ug | ORAL_TABLET | Freq: Every day | ORAL | 3 refills | Status: DC

## 2020-04-14 MED FILL — LEVOTHYROXINE 100 MCG TABLE: 100 | 90 days supply | Qty: 90 | Fill #0

## 2020-04-14 NOTE — Telephone Encounter (Signed)
Telephoned patient at home number. Patient states needs refill on Synthroid . Patient states dose was increased last visit. Pharmacy needs prescription for new amount. Advised patient would send to provider and check with pharmacy later today. Patient voiced understanding.

## 2020-04-14 NOTE — Telephone Encounter (Signed)
Pt aware that synthroid 100 mcg refilled to [facility]

## 2020-04-14 NOTE — Telephone Encounter (Signed)
Patient called in about needing a refill for her medicine and would like I call back so she can verify the medicine.

## 2020-07-09 MED FILL — LEVOTHYROXINE 100 MCG TABLE: 100 | 90 days supply | Qty: 90 | Fill #1

## 2020-08-07 ENCOUNTER — Ambulatory Visit (INDEPENDENT_AMBULATORY_CARE_PROVIDER_SITE_OTHER): Payer: No Typology Code available for payment source | Admitting: Adult Health

## 2020-08-07 ENCOUNTER — Other Ambulatory Visit: Payer: Self-pay

## 2020-08-07 ENCOUNTER — Encounter: Payer: Self-pay | Admitting: Adult Health

## 2020-08-07 VITALS — BP 115/75 | HR 67 | Ht 62.0 in | Wt 156.5 lb

## 2020-08-07 DIAGNOSIS — R634 Abnormal weight loss: Secondary | ICD-10-CM | POA: Insufficient documentation

## 2020-08-07 DIAGNOSIS — E039 Hypothyroidism, unspecified: Secondary | ICD-10-CM | POA: Diagnosis not present

## 2020-08-07 NOTE — Progress Notes (Signed)
°  Subjective:     Patient ID: Nicole Bell, female   DOB: Feb 20, 1981, 39 y.o.   MRN: [id number]  HPI Nicole Bell is a 39 year old white female, separated, sp hysterectomy in complaining of weight loss of about 10 lbs, was not trying. She has gone on day shift in RT at [employer] and her momma passed in August. She has dry skin and hair loss and has had some heart palpitations like when thyroid was not working right. She did have COVID in May and has had 2 vaccines and her flu shot this year.  PCP is Dr Gerda Diss.   Review of Systems +weight loss about 10 lbs +dry skin and hair loss +heart palpitations at times Reviewed past medical,surgical, social and family history. Reviewed medications and allergies.     Objective:   Physical Exam BP 115/75 (BP Location: Left Arm, Patient Position: Sitting, Cuff Size: Normal)    Pulse 67    Ht 5\' 2"  (1.575 m)    Wt 156 lb 8 oz (71 kg)    BMI 28.62 kg/m  Skin warm and dry. Neck: mid line trachea, normal thyroid, good ROM, no lymphadenopathy noted. Lungs: clear to ausculation bilaterally. Cardiovascular: regular rate and rhythm. Fall risk is low  Upstream - 08/07/20 0914      Pregnancy Intention Screening   Does the patient want to become pregnant in the next year? N/A    Does the patient's partner want to become pregnant in the next year? N/A    Would the patient like to discuss contraceptive options today? N/A      Contraception Wrap Up   Current Method No Method - Other Reason   hyst   End Method No Method - Other Reason   hyst   Contraception Counseling Provided No             Assessment:     1. Hypothyroidism, unspecified type Check TSH and free T4  2. Weight loss Check TSH and free T4    Plan:     Return in 4 weeks for physical and fasting labs If heart palpations persist will get cardiology consult

## 2020-08-08 LAB — T4, FREE: Free T4: 1.61 ng/dL (ref 0.82–1.77)

## 2020-08-08 LAB — TSH: TSH: 0.538 u[IU]/mL (ref 0.450–4.500)

## 2020-09-04 ENCOUNTER — Encounter: Payer: Self-pay | Admitting: Adult Health

## 2020-09-04 ENCOUNTER — Other Ambulatory Visit: Payer: Self-pay

## 2020-09-04 ENCOUNTER — Ambulatory Visit (INDEPENDENT_AMBULATORY_CARE_PROVIDER_SITE_OTHER): Payer: No Typology Code available for payment source | Admitting: Adult Health

## 2020-09-04 VITALS — BP 114/72 | HR 58 | Ht 62.0 in | Wt 149.5 lb

## 2020-09-04 DIAGNOSIS — E039 Hypothyroidism, unspecified: Secondary | ICD-10-CM

## 2020-09-04 DIAGNOSIS — Z01419 Encounter for gynecological examination (general) (routine) without abnormal findings: Secondary | ICD-10-CM | POA: Diagnosis not present

## 2020-09-04 NOTE — Progress Notes (Signed)
Patient ID: KYNZI LEVAY, female   DOB: June 23, 1981, 39 y.o.   MRN: [id number] History of Present Illness:  Nicole Bell is a 39 year old white female,separated, sp hysterectomy in for a well woman gyn exam. 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  PCP is Dr Ladona Ridgel  Current Medications, Allergies, Past Medical History, Past Surgical History, Family History and Social History were reviewed in [facility] electronic medical record.     Review of Systems:  Patient denies any headaches, hearing loss, fatigue, blurred vision, shortness of breath, chest pain, abdominal pain, problems with bowel movements, urination(occasioanl stress UI), or intercourse. No joint pain or mood swings.   Physical Exam:BP 114/72 (BP Location: Left Arm, Patient Position: Sitting, Cuff Size: Normal)    Pulse (!) 58    Ht 5\' 2"  (1.575 m)    Wt 149 lb 8 oz (67.8 kg)    BMI 27.34 kg/m has lost about 6 lbs, but working extra at work  General:  Well developed, well nourished, no acute distress Skin:  Warm and dry Neck:  Midline trachea, normal thyroid, good ROM, no lymphadenopathy Lungs; Clear to auscultation bilaterally Breast:  No dominant palpable mass, retraction, or nipple discharge Cardiovascular: Regular rate and rhythm Abdomen:  Soft, non tender, no hepatosplenomegaly Pelvic:  External genitalia is normal in appearance, has ,1 cm sebaceous cyst left inner labia.  The vagina is normal in appearance. Urethra has no lesions or masses. The cervix and uterus are absent.  No adnexal masses or tenderness noted.Bladder is non tender, no masses felt. Rectal: Deferred til 40. Extremities/musculoskeletal:  No swelling or varicosities noted, no clubbing or cyanosis Psych:  No mood changes, alert and cooperative,seems happy AA is 1 Fall risk is low PHQ 9 score is 1 GAD 7 scoer is 0  Up[facility] - 09/04/20 0854      Pregnancy Intention Screening   Does the patient want to become pregnant in the next year? N/A    Does the patient's partner  want to become pregnant in the next year? N/A    Would the patient like to discuss contraceptive options today? N/A      Contraception Wrap Up   Current Method No Method - Other Reason   hyst   End Method No Method - Other Reason   hyst   Contraception Counseling Provided No         Examination chaperoned by 09/06/20 LPN  Impression and Plan: 1. Encounter for well woman exam with routine gynecological exam Physical in 1 year Mammogram at 41   2. Hypothyroidism, unspecified type Continue synthroid 100 mcg daily, has refills Recheck TSH and free T 4 in 6 months

## 2020-10-27 MED FILL — LEVOTHYROXINE 100 MCG TABLE: 100 | 90 days supply | Qty: 90 | Fill #2

## 2020-11-04 ENCOUNTER — Encounter: Payer: Self-pay | Admitting: Family Medicine

## 2020-11-04 ENCOUNTER — Ambulatory Visit (INDEPENDENT_AMBULATORY_CARE_PROVIDER_SITE_OTHER): Payer: No Typology Code available for payment source | Admitting: Family Medicine

## 2020-11-04 VITALS — BP 118/74 | HR 76 | Temp 97.3°F | Ht 62.0 in | Wt 145.0 lb

## 2020-11-04 DIAGNOSIS — F32A Depression, unspecified: Secondary | ICD-10-CM | POA: Diagnosis not present

## 2020-11-04 DIAGNOSIS — F419 Anxiety disorder, unspecified: Secondary | ICD-10-CM | POA: Diagnosis not present

## 2020-11-04 DIAGNOSIS — F4321 Adjustment disorder with depressed mood: Secondary | ICD-10-CM

## 2020-11-04 NOTE — Progress Notes (Signed)
Patient ID: Nicole Bell, female    DOB: 05-16-1981, 40 y.o.   MRN: [id number]   Chief Complaint  Patient presents with  . Depression   Subjective:    HPI  Cc- depression/grief  Pt would like a referral to therapist. Lost mother in august and trying to work through it on her own but having trouble doing that. Step father also passed last month.   Mother passed in summer 2021. Had been sick and having had time getting past it. In house a lot. Pt needed to make her a DNR while mother was sick and that is weighing on her.  Pt also was not able to be there when she passed.   Pt is working in health care with covid pt and watching a lot of death and dying. Step father died in [place].  In Feb 15, 2023 ended her relationship.  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  Hard to get past some things due to the covid situation in working.  Daughter has anxiety and getting therapy.  Not sleeping well..  Lost appetite.  Lost about 20 lbs.  Has h/o nervous stomach. Getting diarrhea when nervous. H/o of this as a child.  Medical History Nicole Bell has a past medical history of Abnormal Pap smear, Cervical high risk human papillomavirus (HPV) DNA test positive (04/17/2017), COVID-19, HSV-2 (herpes simplex virus 2) infection, Hyperthyroidism (12/28/2009), IBS (irritable bowel syndrome), Nicotine addiction (08/05/2013), and Vaginal Pap smear, abnormal.   Outpatient Encounter Medications as of 11/04/2020  Medication Sig  . Aspirin-Acetaminophen-Caffeine (EXCEDRIN MIGRAINE PO) Take 1 tablet by mouth daily as needed (headache).   Marland Kitchen ibuprofen (ADVIL,MOTRIN) 200 MG tablet Take 400 mg by mouth as needed.  Marland Kitchen levothyroxine (SYNTHROID) 100 MCG tablet Take 1 tablet (100 mcg total) by mouth daily before breakfast.   No facility-administered encounter medications on file as of 11/04/2020.     Review of Systems  Constitutional: Positive for appetite change and unexpected weight change (dec weight). Negative for  chills and fever.  HENT: Negative for congestion, rhinorrhea and sore throat.   Respiratory: Negative for cough, shortness of breath and wheezing.   Cardiovascular: Negative for chest pain and leg swelling.  Gastrointestinal: Negative for abdominal pain, diarrhea, nausea and vomiting.  Genitourinary: Negative for dysuria and frequency.  Musculoskeletal: Negative for arthralgias and back pain.  Skin: Negative for rash.  Neurological: Negative for dizziness, weakness and headaches.  Psychiatric/Behavioral: Positive for dysphoric mood and sleep disturbance. Negative for self-injury and suicidal ideas. The patient is nervous/anxious.      Vitals BP 118/74   Pulse 76   Temp (!) 97.3 F (36.3 C)   Ht 5\' 2"  (1.575 m)   Wt 145 lb (65.8 kg)   SpO2 99%   BMI 26.52 kg/m   Objective:   Physical Exam Vitals and nursing note reviewed.  Constitutional:      General: She is not in acute distress.    Appearance: Normal appearance. She is normal weight. She is not ill-appearing.  HENT:     Head: Normocephalic.  Cardiovascular:     Rate and Rhythm: Normal rate.     Pulses: Normal pulses.  Pulmonary:     Effort: Pulmonary effort is normal.  Skin:    General: Skin is warm and dry.  Neurological:     General: No focal deficit present.     Mental Status: She is alert and oriented to person, place, and time.  Psychiatric:  Behavior: Behavior normal.        Thought Content: Thought content normal.        Judgment: Judgment normal.     Comments: +tearful, depressed and anxious mood      Assessment and Plan   1. Anxiety - Ambulatory referral to Psychology  2. Depression, unspecified depression type - Ambulatory referral to Psychology  3. Grief reaction - Ambulatory referral to Psychology   Pt feeling that she just wanting to talk to counselor.  Not feeling needing any medications at this time for this.  Will call back if feeling she is needing  medications.  hypothyrodism- TSH and t4- is normal in 11/21. Pt taking synthroid.  Stable.  Seeing gyn for this.  F/u prn.

## 2020-11-15 ENCOUNTER — Encounter: Payer: Self-pay | Admitting: Family Medicine

## 2020-11-26 ENCOUNTER — Encounter (HOSPITAL_COMMUNITY): Payer: Self-pay

## 2020-11-26 ENCOUNTER — Ambulatory Visit (INDEPENDENT_AMBULATORY_CARE_PROVIDER_SITE_OTHER): Payer: No Typology Code available for payment source | Admitting: Clinical

## 2020-11-26 ENCOUNTER — Other Ambulatory Visit: Payer: Self-pay

## 2020-11-26 DIAGNOSIS — F331 Major depressive disorder, recurrent, moderate: Secondary | ICD-10-CM | POA: Diagnosis not present

## 2020-11-26 DIAGNOSIS — Z634 Disappearance and death of family member: Secondary | ICD-10-CM | POA: Diagnosis not present

## 2020-11-26 DIAGNOSIS — F419 Anxiety disorder, unspecified: Secondary | ICD-10-CM | POA: Diagnosis not present
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
## 2020-12-17 ENCOUNTER — Ambulatory Visit (HOSPITAL_COMMUNITY): Payer: No Typology Code available for payment source | Admitting: Clinical

## 2020-12-29 ENCOUNTER — Other Ambulatory Visit (HOSPITAL_COMMUNITY): Payer: Self-pay

## 2021-01-07 ENCOUNTER — Ambulatory Visit (HOSPITAL_COMMUNITY): Payer: No Typology Code available for payment source | Admitting: Clinical

## 2021-01-29 ENCOUNTER — Ambulatory Visit (HOSPITAL_COMMUNITY): Payer: No Typology Code available for payment source | Admitting: Clinical

## 2021-02-01 ENCOUNTER — Other Ambulatory Visit (HOSPITAL_COMMUNITY): Payer: Self-pay

## 2021-02-01 MED FILL — Levothyroxine Sodium Tab 100 MCG: ORAL | 90 days supply | Qty: 90 | Fill #0 | Status: AC

## 2021-02-02 ENCOUNTER — Other Ambulatory Visit (HOSPITAL_COMMUNITY): Payer: Self-pay

## 2021-02-03 ENCOUNTER — Other Ambulatory Visit (HOSPITAL_COMMUNITY): Payer: Self-pay

## 2021-02-03 MED ORDER — FLUCONAZOLE 150 MG PO TABS
150.0000 mg | ORAL_TABLET | Freq: Every day | ORAL | 1 refills | Status: DC
  Filled 2021-02-03: qty 2, 2d supply, fill #0

## 2021-02-03 MED ORDER — AMOXICILLIN 500 MG PO CAPS
ORAL_CAPSULE | Freq: Three times a day (TID) | ORAL | 1 refills | Status: DC
  Filled 2021-02-03: qty 21, 6d supply, fill #0

## 2021-05-20 ENCOUNTER — Telehealth: Payer: No Typology Code available for payment source | Admitting: Family Medicine

## 2021-05-20 ENCOUNTER — Other Ambulatory Visit (HOSPITAL_COMMUNITY): Payer: Self-pay

## 2021-05-20 ENCOUNTER — Other Ambulatory Visit: Payer: Self-pay | Admitting: Adult Health

## 2021-05-20 DIAGNOSIS — B379 Candidiasis, unspecified: Secondary | ICD-10-CM | POA: Diagnosis not present

## 2021-05-20 DIAGNOSIS — R300 Dysuria: Secondary | ICD-10-CM | POA: Diagnosis not present

## 2021-05-20 DIAGNOSIS — T3695XA Adverse effect of unspecified systemic antibiotic, initial encounter: Secondary | ICD-10-CM

## 2021-05-20 MED ORDER — LEVOTHYROXINE SODIUM 100 MCG PO TABS
100.0000 ug | ORAL_TABLET | Freq: Every day | ORAL | 3 refills | Status: DC
  Filled 2021-05-20: qty 90, 90d supply, fill #0
  Filled 2021-09-08: qty 90, 90d supply, fill #1
  Filled 2021-12-12: qty 90, 90d supply, fill #2
  Filled 2022-03-14: qty 90, 90d supply, fill #3

## 2021-05-20 MED ORDER — NITROFURANTOIN MONOHYD MACRO 100 MG PO CAPS: 100.0000 mg | ORAL_CAPSULE | Freq: Two times a day (BID) | ORAL | 0 refills | Status: AC

## 2021-05-20 MED ORDER — FLUCONAZOLE 150 MG PO TABS: 150.0000 mg | ORAL_TABLET | Freq: Once | ORAL | 0 refills | Status: AC

## 2021-05-20 NOTE — Patient Instructions (Signed)
I appreciate the opportunity to provide you with care for your health and wellness.  Drink plenty of water and complete the full course of medication  I hope you feel better soon!  Please continue to practice social distancing to keep you, your family, and our community safe.  If you must go out, please wear a mask and practice good handwashing.  Have a wonderful day. With Gratitude, Nicole Coop, DNP, AGNP-BC  Urinary Tract Infection, Adult A urinary tract infection (UTI) is an infection of any part of the urinary tract. The urinary tract includes the kidneys, ureters, bladder, and urethra. These organs make, store, and get rid of urine in the body. An upper UTI affects the ureters and kidneys. A lower UTI affects the bladder and urethra. What are the causes? Most urinary tract infections are caused by bacteria in your genital area around your urethra, where urine leaves your body. These bacteria grow and cause inflammation of your urinary tract. What increases the risk? You are more likely to develop this condition if: You have a urinary catheter that stays in place. You are not able to control when you urinate or have a bowel movement (incontinence). You are female and you: Use a spermicide or diaphragm for birth control. Have low estrogen levels. Are pregnant. You have certain genes that increase your risk. You are sexually active. You take antibiotic medicines. You have a condition that causes your flow of urine to slow down, such as: An enlarged prostate, if you are female. Blockage in your urethra. A kidney stone. A nerve condition that affects your bladder control (neurogenic bladder). Not getting enough to drink, or not urinating often. You have certain medical conditions, such as: Diabetes. A weak disease-fighting system (immunesystem). Sickle cell disease. Gout. Spinal cord injury. What are the signs or symptoms? Symptoms of this condition include: Needing to  urinate right away (urgency). Frequent urination. This may include small amounts of urine each time you urinate. Pain or burning with urination. Blood in the urine. Urine that smells bad or unusual. Trouble urinating. Cloudy urine. Vaginal discharge, if you are female. Pain in the abdomen or the lower back. You may also have: Vomiting or a decreased appetite. Confusion. Irritability or tiredness. A fever or chills. Diarrhea. The first symptom in older adults may be confusion. In some cases, they may not have any symptoms until the infection has worsened. How is this diagnosed? This condition is diagnosed based on your medical history and a physical exam. You may also have other tests, including: Urine tests. Blood tests. Tests for STIs (sexually transmitted infections). If you have had more than one UTI, a cystoscopy or imaging studies may be done to determine the cause of the infections. How is this treated? Treatment for this condition includes: Antibiotic medicine. Over-the-counter medicines to treat discomfort. Drinking enough water to stay hydrated. If you have frequent infections or have other conditions such as a kidney stone, you may need to see a health care provider who specializes in the urinary tract (urologist). In rare cases, urinary tract infections can cause sepsis. Sepsis is a life-threatening condition that occurs when the body responds to an infection. Sepsis is treated in the hospital with IV antibiotics, fluids, and other medicines. Follow these instructions at home: Medicines Take over-the-counter and prescription medicines only as told by your health care provider. If you were prescribed an antibiotic medicine, take it as told by your health care provider. Do not stop using the antibiotic even if you  start to feel better. General instructions Make sure you: Empty your bladder often and completely. Do not hold urine for long periods of time. Empty your  bladder after sex. Wipe from front to back after urinating or having a bowel movement if you are female. Use each tissue only one time when you wipe. Drink enough fluid to keep your urine pale yellow. Keep all follow-up visits. This is important. Contact a health care provider if: Your symptoms do not get better after 1-2 days. Your symptoms go away and then return. Get help right away if: You have severe pain in your back or your lower abdomen. You have a fever or chills. You have nausea or vomiting. Summary A urinary tract infection (UTI) is an infection of any part of the urinary tract, which includes the kidneys, ureters, bladder, and urethra. Most urinary tract infections are caused by bacteria in your genital area. Treatment for this condition often includes antibiotic medicines. If you were prescribed an antibiotic medicine, take it as told by your health care provider. Do not stop using the antibiotic even if you start to feel better. Keep all follow-up visits. This is important. This information is not intended to replace advice given to you by your health care provider. Make sure you discuss any questions you have with your health care provider. Document Revised: 04/17/2020 Document Reviewed: 04/17/2020 Elsevier Patient Education  2022 [employer].

## 2021-05-20 NOTE — Progress Notes (Signed)
Ms. brady, plant are scheduled for a virtual visit with your provider today.    Just as we do with appointments in the office, we must obtain your consent to participate.  Your consent will be active for this visit and any virtual visit you may have with one of our providers in the next 365 days.    If you have a MyChart account, I can also send a copy of this consent to you electronically.  All virtual visits are billed to your insurance company just like a traditional visit in the office.  As this is a virtual visit, video technology does not allow for your provider to perform a traditional examination.  This may limit your provider's ability to fully assess your condition.  If your provider identifies any concerns that need to be evaluated in person or the need to arrange testing such as labs, EKG, etc, we will make arrangements to do so.    Although advances in technology are sophisticated, we cannot ensure that it will always work on either your end or our end.  If the connection with a video visit is poor, we may have to switch to a telephone visit.  With either a video or telephone visit, we are not always able to ensure that we have a secure connection.   I need to obtain your verbal consent now.   Are you willing to proceed with your visit today?   Nicole Bell has provided verbal consent on 05/20/2021 for a virtual visit (video or telephone).   Freddy Finner, NP 05/20/2021  2:08 PM   Date:  05/20/2021   ID:  Nicole Bell, DOB September 05, 1981, MRN [id number]  Patient Location: Home Provider Location: Home Office   Participants: Patient and Provider for Visit and Wrap up  Method of visit: Video  Location of Patient: Home Location of Provider: Home Office Consent was obtain for visit over the video. Services rendered by provider: Visit was performed via video  A video enabled telemedicine application was used and I verified that I am speaking with the correct person using two  identifiers.  PCP:  Annalee Genta, DO   Chief Complaint:  UTI   History of Present Illness:    Nicole Bell is a 40 y.o. female with history as stated below. Presents video telehealth for an acute care visit due to UTI symptoms. Increase urination and burning.  No pressure or pain in lower pelvic area, no back of flank pain. Denies fevers, chills, n/v/d or other changes to bladder or bowel habits. One week since intercourse- no new partners no risk of STI she is aware of. Denies blood in urine.  Modifying factors include: hydration No other aggravating or relieving factors.  No other c/o.  Past Medical, Surgical, Social History, Allergies, and Medications have been Reviewed.  Past Medical History:  Diagnosis Date   Abnormal Pap smear    Cervical high risk human papillomavirus (HPV) DNA test positive 04/17/2017   Repeat pap in 1 year   COVID-19    HSV-2 (herpes simplex virus 2) infection    Hyperthyroidism 12/28/2009   IBS (irritable bowel syndrome)    Nicotine addiction 08/05/2013   Vaginal Pap smear, abnormal     No outpatient medications have been marked as taking for the 05/20/21 encounter (Appointment) with [facility] PROVIDER.     Allergies:   Patient has no known allergies.   ROS See HPI for history of present illness.  Physical Exam Constitutional:  Appearance: Normal appearance.  HENT:     Head: Normocephalic.     Nose: Nose normal.  Eyes:     Conjunctiva/sclera: Conjunctivae normal.  Pulmonary:     Effort: Pulmonary effort is normal.  Musculoskeletal:        General: Normal range of motion.     Cervical back: Normal range of motion.  Neurological:     Mental Status: She is alert and oriented to person, place, and time.              A&P  1. Antibiotic-induced yeast infection   - fluconazole (DIFLUCAN) 150 MG tablet; Take 1 tablet (150 mg total) by mouth once for 1 dose.  Dispense: 1 tablet; Refill: 0  2. Dysuria S&S are consistent  with UTI tx provided. Smyptom and prevention reviewed and on AVS  - nitrofurantoin, macrocrystal-monohydrate, (MACROBID) 100 MG capsule; Take 1 capsule (100 mg total) by mouth 2 (two) times daily for 5 days.  Dispense: 10 capsule; Refill: 0  I discussed the assessment and treatment plan with the patient. The patient was provided an opportunity to ask questions and all were answered. The patient agreed with the plan and demonstrated an understanding of the instructions.   The patient was advised to call back or seek an in-person evaluation if the symptoms worsen or if the condition fails to improve as anticipated.   The above assessment and management plan was discussed with the patient. The patient verbalized understanding of and has agreed to the management plan. Patient is aware to call the clinic if symptoms persist or worsen. Patient is aware when to return to the clinic for a follow-up visit. Patient educated on when it is appropriate to go to the emergency department.   Time:   Today, I have spent 10 minutes with the patient with telehealth technology discussing the above problems, reviewing the chart, previous notes, medications and orders.   Medication Changes: No orders of the defined types were placed in this encounter.    Disposition:  Follow up PRN/PCP Signed, Freddy Finner, NP  05/20/2021 2:08 PM

## 2021-05-26 ENCOUNTER — Other Ambulatory Visit (HOSPITAL_COMMUNITY): Payer: Self-pay

## 2021-06-25 ENCOUNTER — Telehealth: Payer: No Typology Code available for payment source | Admitting: Physician Assistant

## 2021-06-25 DIAGNOSIS — T3695XA Adverse effect of unspecified systemic antibiotic, initial encounter: Secondary | ICD-10-CM | POA: Diagnosis not present

## 2021-06-25 DIAGNOSIS — B379 Candidiasis, unspecified: Secondary | ICD-10-CM | POA: Diagnosis not present

## 2021-06-25 MED ORDER — FLUCONAZOLE 150 MG PO TABS: 150.0000 mg | ORAL_TABLET | Freq: Once | ORAL | 0 refills | Status: AC

## 2021-06-25 NOTE — Progress Notes (Signed)

## 2021-09-09 ENCOUNTER — Other Ambulatory Visit (HOSPITAL_COMMUNITY): Payer: Self-pay

## 2021-12-08 ENCOUNTER — Encounter: Payer: No Typology Code available for payment source | Admitting: Nurse Practitioner

## 2021-12-13 ENCOUNTER — Other Ambulatory Visit (HOSPITAL_COMMUNITY): Payer: Self-pay

## 2021-12-14 ENCOUNTER — Ambulatory Visit (INDEPENDENT_AMBULATORY_CARE_PROVIDER_SITE_OTHER): Payer: No Typology Code available for payment source | Admitting: Nurse Practitioner

## 2021-12-14 ENCOUNTER — Encounter: Payer: Self-pay | Admitting: Nurse Practitioner

## 2021-12-14 VITALS — BP 117/79 | HR 55 | Temp 98.2°F | Ht 62.0 in | Wt 135.0 lb

## 2021-12-14 DIAGNOSIS — Z0000 Encounter for general adult medical examination without abnormal findings: Secondary | ICD-10-CM

## 2021-12-14 DIAGNOSIS — Z23 Encounter for immunization: Secondary | ICD-10-CM

## 2021-12-14 DIAGNOSIS — Z131 Encounter for screening for diabetes mellitus: Secondary | ICD-10-CM

## 2021-12-14 DIAGNOSIS — I498 Other specified cardiac arrhythmias: Secondary | ICD-10-CM | POA: Diagnosis not present

## 2021-12-14 DIAGNOSIS — E7800 Pure hypercholesterolemia, unspecified: Secondary | ICD-10-CM

## 2021-12-14 DIAGNOSIS — E039 Hypothyroidism, unspecified: Secondary | ICD-10-CM | POA: Diagnosis not present

## 2021-12-14 DIAGNOSIS — G43109 Migraine with aura, not intractable, without status migrainosus: Secondary | ICD-10-CM | POA: Diagnosis not present

## 2021-12-14 NOTE — Progress Notes (Signed)
? ?Subjective:  ? ? Patient ID: Nicole Bell, female    DOB: 07/27/1981, 41 y.o.   MRN: [id number] ? ?HPI ? ?41 year old female with history of anxiety, migraines, IBS, hysterectomy, and hypothyroidism patient comes in today for a wellness visit. ? ?A review of their health history was completed. ? A review of medications was also completed. ? ?Any needed refills; no ? ?Eating habits: fair ? ?Falls/  MVA accidents in past few months: no ? ?Regular exercise: works and at home some exercise ? ?Specialist pt sees on regular basis: none ? ?Preventative health issues were discussed.  ? ?Additional concerns:  ? ?Migraines. ?Patient reports having a history of migraines.  However she states in the past 2 weeks migraines have been more frequent with her having a headache 8 out of past 14 days.  Patient states that migraines are preceded by her having white spots and peripheral vision loss along with numbness and tingling to her left arm.  Patient also states that she is has nausea and light sensitivity while having her migraines.  Patient typically use migraine Excedrin which she states helps if she is able to catch the migraine in time.  Patient states that she typically gets her migraines in the morning and her last migraine that she had was on Sunday.   ? ?Chest fluttering. ?Patient also states that she will occasionally have a chest fluttering sensation that she experiences 1-2 times a week where she feels like her heart is "brain.  Patient states that she may feel some chest pressure when this happens.  Patient denies any chest pain, shortness of breath, difficulty breathing. ? ? ?Review of Systems  ?Eyes:  Positive for visual disturbance.  ?Neurological:  Positive for numbness and headaches.  ?All other systems reviewed and are negative. ? ?   ?Objective:  ? Physical Exam ?Constitutional:   ?   General: She is not in acute distress. ?   Appearance: Normal appearance. She is normal weight. She is not ill-appearing,  toxic-appearing or diaphoretic.  ?HENT:  ?   Head: Normocephalic.  ?Eyes:  ?   General: No visual field deficit or scleral icterus.    ?   Right eye: No discharge.     ?   Left eye: No discharge.  ?   Extraocular Movements: Extraocular movements intact.  ?   Conjunctiva/sclera: Conjunctivae normal.  ?   Pupils: Pupils are equal, round, and reactive to light.  ?Cardiovascular:  ?   Rate and Rhythm: Normal rate and regular rhythm.  ?   Pulses: Normal pulses.  ?   Heart sounds: Normal heart sounds. No murmur heard. ?Pulmonary:  ?   Effort: Pulmonary effort is normal. No respiratory distress.  ?   Breath sounds: Normal breath sounds. No wheezing.  ?Abdominal:  ?   General: Abdomen is flat.  ?   Palpations: Abdomen is soft.  ?Musculoskeletal:  ?   Cervical back: Normal range of motion and neck supple. No rigidity or tenderness.  ?   Comments: Grossly intact  ?Lymphadenopathy:  ?   Cervical: No cervical adenopathy.  ?Skin: ?   General: Skin is warm.  ?   Capillary Refill: Capillary refill takes less than 2 seconds.  ?Neurological:  ?   General: No focal deficit present.  ?   Mental Status: She is alert.  ?   Cranial Nerves: Cranial nerves 2-12 are intact. No cranial nerve deficit, dysarthria or facial asymmetry.  ?   Sensory: Sensation is  intact.  ?   Motor: No weakness, tremor, atrophy, abnormal muscle tone or seizure activity.  ?   Coordination: Coordination is intact. Romberg sign negative. Coordination normal.  ?   Gait: Gait is intact. Gait and tandem walk normal.  ?   Comments: Strength 5 out of 5 to upper and lower extremities  ?Psychiatric:     ?   Mood and Affect: Mood normal.     ?   Behavior: Behavior normal.  ? ? ? ? ? ?   ?Assessment & Plan:  ? ?1. Wellness examination ?Adult wellness-complete.wellness physical was conducted today. Importance of diet and exercise were discussed in detail.  ?In addition to this a discussion regarding safety was also covered. We also reviewed over immunizations and gave  recommendations regarding current immunization needed for age.  ?In addition to this additional areas were also touched on including: ?Preventative health exams needed: ? ?Colonoscopy not indicated ?Mammogram not indicated ?PAP not indicated due to hx of hysterectomy ?Tetanus done today ?Hep C and HIV done today ? ?Patient was advised yearly wellness exam  ?- HIV antibody (with reflex) ?- Hepatitis C Antibody ?- Tdap vaccine greater than or equal to 7yo IM ? ?2. Migraine with aura and without status migrainosus, not intractable ?-Patient is interesting presentation with migraines with left-sided numbness and tingling and visual changes. ?-We will refer to neurology for further examination. ?-No neurological deficits noted during neurology exam. ?-Patient encouraged to continue taking Excedrin Migraine and ibuprofen as needed for headaches. ?-Considered topiramate and sumatriptan however due to patient's unique symptomology will refer to neurology for expert opinion. ?- Ambulatory referral to Neurology ?-Return to clinic if symptoms not better or worse ? ?3. Hypothyroidism, unspecified type ?-We will evaluate patient's thyroid and determine if Synthroid 100 mcg still appropriate. ?-If no changes to medications needed we will send in refill of Synthroid. ?- TSH + free T4 ? ?4. Fluttering heart ?-Etiology unclear however patient has history of anxiety.  Heart fluttering may be related to anxiety. ?-EKG normal today. ?-We will evaluate lab values. ?- EKG 12-Lead ?- TSH + free T4 ?- CMP14+EGFR ?- CBC with Differential/Platelet ?-Return to clinic if symptoms not better or worse ? ?5. Elevated cholesterol ?- Lipid Profile ? ?6. Screening for diabetes mellitus ?- Hemoglobin A1c ? ?7. Immunization due ?- Tdap vaccine greater than or equal to 7yo IM ? ?  ?Note:  This document was prepared using Dragon voice recognition software and may include unintentional dictation errors. ? ?

## 2021-12-15 ENCOUNTER — Encounter: Payer: Self-pay | Admitting: Neurology

## 2021-12-15 LAB — CBC WITH DIFFERENTIAL/PLATELET
Basophils Absolute: 0.1 10*3/uL (ref 0.0–0.2)
Basos: 1 %
EOS (ABSOLUTE): 0.1 10*3/uL (ref 0.0–0.4)
Eos: 2 %
Hematocrit: 41.1 % (ref 34.0–46.6)
Hemoglobin: 14.1 g/dL (ref 11.1–15.9)
Immature Grans (Abs): 0 10*3/uL (ref 0.0–0.1)
Immature Granulocytes: 0 %
Lymphocytes Absolute: 2.3 10*3/uL (ref 0.7–3.1)
Lymphs: 35 %
MCH: 30.5 pg (ref 26.6–33.0)
MCHC: 34.3 g/dL (ref 31.5–35.7)
MCV: 89 fL (ref 79–97)
Monocytes Absolute: 0.3 10*3/uL (ref 0.1–0.9)
Monocytes: 5 %
Neutrophils Absolute: 3.6 10*3/uL (ref 1.4–7.0)
Neutrophils: 57 %
Platelets: 208 10*3/uL (ref 150–450)
RBC: 4.62 x10E6/uL (ref 3.77–5.28)
RDW: 12.4 % (ref 11.7–15.4)
WBC: 6.4 10*3/uL (ref 3.4–10.8)

## 2021-12-15 LAB — CMP14+EGFR
ALT: 11 IU/L (ref 0–32)
AST: 11 IU/L (ref 0–40)
Albumin/Globulin Ratio: 2.3 — ABNORMAL HIGH (ref 1.2–2.2)
Albumin: 4.5 g/dL (ref 3.8–4.8)
Alkaline Phosphatase: 58 IU/L (ref 44–121)
BUN/Creatinine Ratio: 11 (ref 9–23)
BUN: 9 mg/dL (ref 6–20)
Bilirubin Total: 0.4 mg/dL (ref 0.0–1.2)
CO2: 23 mmol/L (ref 20–29)
Calcium: 9.2 mg/dL (ref 8.7–10.2)
Chloride: 101 mmol/L (ref 96–106)
Creatinine, Ser: 0.8 mg/dL (ref 0.57–1.00)
Globulin, Total: 2 g/dL (ref 1.5–4.5)
Glucose: 91 mg/dL (ref 70–99)
Potassium: 3.6 mmol/L (ref 3.5–5.2)
Sodium: 137 mmol/L (ref 134–144)
Total Protein: 6.5 g/dL (ref 6.0–8.5)
eGFR: 96 mL/min/{1.73_m2} (ref >59)

## 2021-12-15 LAB — LIPID PANEL
Chol/HDL Ratio: 3.1 ratio (ref 0.0–4.4)
Cholesterol, Total: 174 mg/dL (ref 100–199)
HDL: 56 mg/dL (ref >39)
LDL Chol Calc (NIH): 102 mg/dL — ABNORMAL HIGH (ref 0–99)
Triglycerides: 86 mg/dL (ref 0–149)
VLDL Cholesterol Cal: 16 mg/dL (ref 5–40)

## 2021-12-15 LAB — HEPATITIS C ANTIBODY: Hep C Virus Ab: NONREACTIVE

## 2021-12-15 LAB — HEMOGLOBIN A1C
Est. average glucose Bld gHb Est-mCnc: 105 mg/dL
Hgb A1c MFr Bld: 5.3 % (ref 4.8–5.6)

## 2021-12-15 LAB — TSH+FREE T4
Free T4: 1.34 ng/dL (ref 0.82–1.77)
TSH: 0.847 u[IU]/mL (ref 0.450–4.500)

## 2021-12-15 LAB — HIV ANTIBODY (ROUTINE TESTING W REFLEX): HIV Screen 4th Generation wRfx: NONREACTIVE

## 2022-01-31 NOTE — Progress Notes (Signed)
? ?NEUROLOGY CONSULTATION NOTE ? ?Nicole Bell ?MRN: [id number] ?DOB: 27-Aug-1981 ? ?Referring provider: Alvis Lemmings, NP ?Primary care provider: Alvis Lemmings, NP ? ?Reason for consult:  migraines ? ?Assessment/Plan:  ? ?Migraine with aura, without status migrainosus, not intractable - increased flurry likely triggered by increased stress and sleep deprivation.  Now at baseline. ? ?To help reduce headache frequency further, she will work on lifestyle modification (decrease caffeine intake, increase water intake, no skipped meals, optimize sleep hygiene) and consider vitamins/supplements (magnesium citrate 400mg  daily, riboflavin 400mg  daily and Co Q-10 100mg  three times daily) ?Limit use of pain relievers to no more than 2 days out of week to prevent risk of rebound or medication-overuse headache. ?Keep headache diary ?If migraines due not improve or worsen, she will contact me and we can start a preventative and have her follow up.  Otherwise, follow up as needed. ? ?Subjective:  ?Nicole Bell is a 41 year old right-handed female with hypothyroidism who presents for migraines.  History supplemented by referring provider's note. ? ? ?History of migraines since childhood.  She may have a dull nonthrobbing headache behind the left eyebrow preceded by peripheral vision loss, bright light in her vision, slurred speech, left arm numbness and tingling in the fingertips of her right hand.  There is associated photophobia and phonophobia.  Rarely, the headache is more severe.  They typically last an hour with Excedrin and occur 1 to 2 days a week.  Stress is a likely trigger. ? ?A couple of months ago, she had near daily migraines over a 2 week period.  She does report increased stress at that time.  She and her boyfriend were caring for her boyfriend's father who was suffering from cancer.  She was not getting much sleep and wasn't eating much.  The headaches subsided after 2 weeks and have since returned to baseline.    ? ?Current NSAIDS/analgesics:  Excedrin Migraine, ibuprofen ?Current triptans:  none ?Current ergotamine:  none ?Current anti-emetic:  none ?Current muscle relaxants:  none ?Current Antihypertensive medications:  none ?Current Antidepressant medications:  none ?Current Anticonvulsant medications:  none ?Current anti-CGRP:  none ?Current Vitamins/Herbal/Supplements:  none ?Current Antihistamines/Decongestants:  none ?Other therapy:  none ?Hormone/birth control:  none ?Other medications:  Synthroid ? ?Past NSAIDS/analgesics:  none ?Past abortive triptans:  none ?Past abortive ergotamine:  none ?Past muscle relaxants:  none ?Past anti-emetic:  Zofran ?Past antihypertensive medications:  none ?Past antidepressant medications:  none ?Past anticonvulsant medications:  none ?Past anti-CGRP:  none ?Past vitamins/Herbal/Supplements:  none ?Past antihistamines/decongestants:  none ?Other past therapies:  none ? ?Caffeine:  a lot of sweet tea.  Sometimes soda ?Diet:  sometimes water but with caffeine.  Sometimes skips meals (eats at least twice a day) ?Exercise:  at work she runs stairs (respiratory therapist) ?Depression:  no; Anxiety:  some ?Other pain:  no ?Sleep hygiene:  poor - easily wakes up.  Interrupted.  Will sleep 3-4 straight hours.  Falls back asleep quickly ?Family history of migraine:  Mother,  both daughters ? ?  ? ? ?PAST MEDICAL HISTORY: ?Past Medical History:  ?Diagnosis Date  ? Abnormal Pap smear   ? Cervical high risk human papillomavirus (HPV) DNA test positive 04/17/2017  ? Repeat pap in 1 year  ? COVID-19   ? HSV-2 (herpes simplex virus 2) infection   ? Hyperthyroidism 12/28/2009  ? IBS (irritable bowel syndrome)   ? Nicotine addiction 08/05/2013  ? Vaginal Pap smear, abnormal   ? ? ?PAST  SURGICAL HISTORY: ?Past Surgical History:  ?Procedure Laterality Date  ? CERVICAL CONIZATION W/BX  01/25/2012  ? Procedure: CONIZATION CERVIX WITH BIOPSY;  Surgeon: Lazaro Arms, MD;  Location: [facility];  Service:  Gynecology;  Laterality: N/A;  Laser conization of cervix  ? CHOLECYSTECTOMY  2010  ? [facility]  ? RECTOVAGINAL FISTULA CLOSURE  2004  ? VAGINAL HYSTERECTOMY N/A 06/13/2018  ? Procedure: HYSTERECTOMY VAGINAL;  Surgeon: Lazaro Arms, MD;  Location: [facility];  Service: Gynecology;  Laterality: N/A;  ? WISDOM TOOTH EXTRACTION    ? ? ?MEDICATIONS: ?Current Outpatient Medications on File Prior to Visit  ?Medication Sig Dispense Refill  ? Aspirin-Acetaminophen-Caffeine (EXCEDRIN MIGRAINE PO) Take 1 tablet by mouth daily as needed (headache).     ? ibuprofen (ADVIL,MOTRIN) 200 MG tablet Take 400 mg by mouth as needed.    ? levothyroxine (SYNTHROID) 100 MCG tablet Take 1 tablet (100 mcg total) by mouth daily before breakfast. 90 tablet 3  ? ?No current facility-administered medications on file prior to visit.  ? ? ?ALLERGIES: ?No Known Allergies ? ?FAMILY HISTORY: ?Family History  ?Problem Relation Age of Onset  ? Cancer Father   ?     stomach   ? COPD Father   ? Cancer Maternal Grandmother   ?     Breast   ? Diabetes Mother   ? COPD Mother   ? Fibromyalgia Mother   ? Emphysema Mother   ? Stroke Mother   ? Heart attack Mother   ? Asthma Daughter   ? Diabetes Other   ? Hypertension Other   ? Coronary artery disease Other   ? Anesthesia problems Neg Hx   ? Hypotension Neg Hx   ? Malignant hyperthermia Neg Hx   ? Pseudochol deficiency Neg Hx   ? ? ?Objective:  ?Blood pressure 113/70, pulse 69, height 5\' 2"  (1.575 m), weight 134 lb (60.8 kg), SpO2 100 %. ?General: No acute distress.  Patient appears well-groomed.   ?Head:  Normocephalic/atraumatic ?Eyes:  fundi examined but not visualized ?Neck: supple, no paraspinal tenderness, full range of motion ?Heart: regular rate and rhythm ?Neurological Exam: ?Mental status: alert and oriented to person, place, and time, recent and remote memory intact, fund of knowledge intact, attention and concentration intact, speech fluent and not dysarthric, language intact. ?Cranial nerves: ?CN  I: not tested ?CN II: pupils equal, round and reactive to light, visual fields intact ?CN III, IV, VI:  full range of motion, no nystagmus, no ptosis ?CN V: facial sensation intact. ?CN VII: upper and lower face symmetric ?CN VIII: hearing intact ?CN IX, X: gag intact, uvula midline ?CN XI: sternocleidomastoid and trapezius muscles intact ?CN XII: tongue midline ?Bulk & Tone: normal, no fasciculations. ?Motor:  muscle strength 5/5 throughout ?Sensation:  Temperature and vibratory sensation intact. ?Deep Tendon Reflexes:  2+ throughout,  toes downgoing.   ?Finger to nose testing:  Without dysmetria.   ?Heel to shin:  Without dysmetria.   ?Gait:  Normal station and stride.  Romberg negative. ? ? ? ?Thank you for allowing me to take part in the care of this patient. ? ? , DO ? ?CC: Shon Millet, NP ? ? ? ? ?

## 2022-02-01 ENCOUNTER — Encounter: Payer: Self-pay | Admitting: Neurology

## 2022-02-01 ENCOUNTER — Ambulatory Visit (INDEPENDENT_AMBULATORY_CARE_PROVIDER_SITE_OTHER): Payer: No Typology Code available for payment source | Admitting: Neurology

## 2022-02-01 VITALS — BP 113/70 | HR 69 | Ht 62.0 in | Wt 134.0 lb

## 2022-02-01 DIAGNOSIS — G43109 Migraine with aura, not intractable, without status migrainosus: Secondary | ICD-10-CM | POA: Diagnosis not present

## 2022-02-01 NOTE — Patient Instructions (Addendum)
?  Consider taking magnesium citrate 400mg  daily, riboflavin 400mg  daily and Coenzyme Q-10 100mg  three times daily ?Limit use of pain relievers to no more than 2 days out of the week.  These medications include acetaminophen, NSAIDs (ibuprofen/Advil/Motrin, naproxen/Aleve, triptans (Imitrex/sumatriptan), Excedrin, and narcotics.  This will help reduce risk of rebound headaches. ?Be aware of common food triggers: ? - Caffeine:  coffee, black tea, cola, Mt. Dew ? - Chocolate ? - Dairy:  aged cheeses (brie, blue, cheddar, gouda, [place], provolone, [place], Swiss, etc), chocolate milk, buttermilk, sour cream, limit eggs and yogurt ? - Nuts, peanut butter ? - Alcohol ? - Cereals/grains:  FRESH breads (fresh bagels, sourdough, doughnuts), yeast productions ? - Processed/canned/aged/cured meats (pre-packaged deli meats, hotdogs) ? - MSG/glutamate:  soy sauce, flavor enhancer, pickled/preserved/marinated foods ? - Sweeteners:  aspartame (Equal, Nutrasweet).  Sugar and Splenda are okay ? - Vegetables:  legumes (lima beans, lentils, snow peas, fava beans, pinto peans, peas, garbanzo beans), sauerkraut, onions, olives, pickles ? - Fruit:  avocados, bananas, citrus fruit (orange, lemon, grapefruit), mango ? - Other:  Frozen meals, macaroni and cheese ?Routine exercise ?Stay adequately hydrated (aim for 64 oz water daily) ?Keep headache diary ?Maintain proper stress management ?Maintain proper sleep hygiene ?Do not skip meals ?If migraines do not improve, contact me and we can start a preventative and have you follow up. ? ?

## 2022-03-15 ENCOUNTER — Other Ambulatory Visit (HOSPITAL_COMMUNITY): Payer: Self-pay

## 2022-03-28 ENCOUNTER — Other Ambulatory Visit (HOSPITAL_COMMUNITY): Payer: Self-pay

## 2022-04-28 ENCOUNTER — Encounter: Payer: Self-pay | Admitting: Family Medicine

## 2022-04-28 ENCOUNTER — Other Ambulatory Visit: Payer: Self-pay | Admitting: Family Medicine

## 2022-04-28 ENCOUNTER — Ambulatory Visit (HOSPITAL_COMMUNITY)
Admission: RE | Admit: 2022-04-28 | Discharge: 2022-04-28 | Disposition: A | Discharge status: Home / Self Care | Payer: No Typology Code available for payment source | Source: Ambulatory Visit | Attending: Family Medicine | Admitting: Family Medicine

## 2022-04-28 DIAGNOSIS — M545 Low back pain, unspecified: Secondary | ICD-10-CM | POA: Insufficient documentation

## 2022-04-29 ENCOUNTER — Encounter: Payer: Self-pay | Admitting: Family Medicine

## 2022-04-29 ENCOUNTER — Ambulatory Visit: Payer: No Typology Code available for payment source | Admitting: Family Medicine

## 2022-04-29 ENCOUNTER — Ambulatory Visit (INDEPENDENT_AMBULATORY_CARE_PROVIDER_SITE_OTHER): Payer: No Typology Code available for payment source | Admitting: Family Medicine

## 2022-04-29 DIAGNOSIS — M545 Low back pain, unspecified: Secondary | ICD-10-CM | POA: Diagnosis not present

## 2022-04-29 MED ORDER — MELOXICAM 15 MG PO TABS: ORAL_TABLET | ORAL | 0 refills | Status: DC

## 2022-04-29 NOTE — Assessment & Plan Note (Signed)
Patient's x-ray was reviewed.  Interpretation: Normal x-ray lumbar spine.  No significant degenerative changes or evidence of acute fracture or spondylolisthesis.  Meloxicam as directed.  Advise rest and heat.  Supportive care.

## 2022-04-29 NOTE — Progress Notes (Signed)
Subjective:  Patient ID: Nicole Bell, female    DOB: [birth year]  Age: 42 y.o. MRN: [id number]  CC: Chief Complaint  Patient presents with   Back Pain    Lower back pain a couple weeks ago that did improved. A few days ago pain came back. When standing pt feels pressure; when sitting and getting up to walk very painful. Pt states it feels like it needs to be popped. Yesterday after sitting for a while pt reports pt in top of buttock area. This morning with sitting pain was in left hip. Some swelling in right buttock area. No injuries known. 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 Pressing down on hip relieves pain.     HPI:  41 year old female presents for evaluation of low back pain.  Patient reports that a couple weeks ago she developed low back pain which lasted for approximately 1 week and then resolved.  Recurred again 3 to 4 days ago.  No known inciting factor.  Pain is located on the left side of the low back particularly.  She reports stiffness.  Worse with prolonged sitting and when she first gets up.  Seems to improve with activity.  She has used ibuprofen without relief.  No saddle anesthesia, incontinence, or numbness/paresthesias.  No other associated symptoms.  No other complaints.  Patient Active Problem List   Diagnosis Date Noted   Low back pain 04/29/2022   S/P vaginal hysterectomy 06/13/2018   Elevated cholesterol 04/16/2018   Hypothyroidism 04/16/2018   Surveillance for Depo-Provera contraception 04/16/2018   Cervical high risk human papillomavirus (HPV) DNA test positive 04/17/2017   Nicotine addiction 08/05/2013   IBS (irritable bowel syndrome) 08/05/2013    Social Hx   Social History   Socioeconomic History   Marital status: Legally Separated    Spouse name: Not on file   Number of children: Not on file   Years of education: Not on file   Highest education level: Not on file  Occupational History   Not on file  Tobacco Use    Smoking status: Former    Packs/day: 0.50    Years: 15.00    Total pack years: 7.50    Types: Cigarettes    Quit date: 11/08/2017    Years since quitting: 4.4   Smokeless tobacco: Never  Vaping Use   Vaping Use: Never used  Substance and Sexual Activity   Alcohol use: No   Drug use: No   Sexual activity: Yes    Birth control/protection: Surgical    Comment: hyst  Other Topics Concern   Not on file  Social History Narrative   Not on file   Social Determinants of Health   Financial Resource Strain: Low Risk  (09/04/2020)   Overall Financial Resource Strain (CARDIA)    Difficulty of Paying Living Expenses: Not very hard  Food Insecurity: No Food Insecurity (09/04/2020)   Hunger Vital Sign    Worried About Running Out of Food in the Last Year: Never true    Ran Out of Food in the Last Year: Never true  Transportation Needs: No Transportation Needs (09/04/2020)   PRAPARE - Administrator, Civil Service (Medical): No    Lack of Transportation (Non-Medical): No  Physical Activity: Insufficiently Active (09/04/2020)   Exercise Vital Sign    Days of Exercise per Week: 2 days    Minutes of Exercise per Session: 20 min  Stress: Stress Concern Present (  09/04/2020)   [place] [employer] - Occupational Stress Questionnaire    Feeling of Stress : To some extent  Social Connections: Moderately Isolated (09/04/2020)   Social Connection and Isolation Panel [NHANES]    Frequency of Communication with Friends and Family: More than three times a week    Frequency of Social Gatherings with Friends and Family: Twice a week    Attends Religious Services: 1 to 4 times per year    Active Member of [employer] or Organizations: No    Attends Engineer, structural: Never    Marital Status: Divorced    Review of Systems Per HPI  Objective:  BP 105/66   Pulse (!) 58   Temp 98.2 F (36.8 C)   Wt 131 lb 12.8 oz (59.8 kg)   SpO2 100%   BMI 24.11 kg/m       04/29/2022   11:03 AM 02/01/2022   11:23 AM 12/14/2021    1:58 PM  BP/Weight  Systolic BP 105 113 117  Diastolic BP 66 70 79  Wt. (Lbs) 131.8 134 135  BMI 24.11 kg/m2 24.51 kg/m2 24.69 kg/m2    Physical Exam Vitals and nursing note reviewed.  Constitutional:      General: She is not in acute distress.    Appearance: Normal appearance.  HENT:     Head: Normocephalic and atraumatic.  Cardiovascular:     Rate and Rhythm: Normal rate and regular rhythm.  Pulmonary:     Effort: Pulmonary effort is normal.     Breath sounds: Normal breath sounds. No wheezing or rales.  Musculoskeletal:     Comments: No significant tenderness over the lumbar spine.  Neurological:     Mental Status: She is alert.  Psychiatric:        Mood and Affect: Mood normal.        Behavior: Behavior normal.     Lab Results  Component Value Date   WBC 6.4 12/14/2021   HGB 14.1 12/14/2021   HCT 41.1 12/14/2021   PLT 208 12/14/2021   GLUCOSE 91 12/14/2021   CHOL 174 12/14/2021   TRIG 86 12/14/2021   HDL 56 12/14/2021   LDLCALC 102 (H) 12/14/2021   ALT 11 12/14/2021   AST 11 12/14/2021   NA 137 12/14/2021   K 3.6 12/14/2021   CL 101 12/14/2021   CREATININE 0.80 12/14/2021   BUN 9 12/14/2021   CO2 23 12/14/2021   TSH 0.847 12/14/2021   HGBA1C 5.3 12/14/2021     Assessment & Plan:   Problem List Items Addressed This Visit       Other   Low back pain    Patient's x-ray was reviewed.  Interpretation: Normal x-ray lumbar spine.  No significant degenerative changes or evidence of acute fracture or spondylolisthesis.  Meloxicam as directed.  Advise rest and heat.  Supportive care.      Relevant Medications   meloxicam (MOBIC) 15 MG tablet    Meds ordered this encounter  Medications   meloxicam (MOBIC) 15 MG tablet    Sig: 1 tablet daily for 1-2 weeks, then daily PRN.    Dispense:  30 tablet    Refill:  0   Linkyn Gobin DO [facility]

## 2022-04-29 NOTE — Patient Instructions (Signed)
Rest.  Heat.  Call with concerns.  Take care  Dr. Adriana Simas

## 2022-06-02 ENCOUNTER — Telehealth: Payer: No Typology Code available for payment source | Admitting: Physician Assistant

## 2022-06-02 DIAGNOSIS — R3989 Other symptoms and signs involving the genitourinary system: Secondary | ICD-10-CM

## 2022-06-02 MED ORDER — CEPHALEXIN 500 MG PO CAPS: 500.0000 mg | ORAL_CAPSULE | Freq: Two times a day (BID) | ORAL | 0 refills | Status: AC

## 2022-06-02 NOTE — Progress Notes (Signed)

## 2022-06-02 NOTE — Progress Notes (Signed)
I have spent 5 minutes in review of e-visit questionnaire, review and updating patient chart, medical decision making and response to patient.   [place] Coldren Cody Nannie Starzyk, PA-C    

## 2022-06-19 ENCOUNTER — Other Ambulatory Visit: Payer: Self-pay | Admitting: Adult Health

## 2022-06-20 ENCOUNTER — Other Ambulatory Visit (HOSPITAL_COMMUNITY): Payer: Self-pay

## 2022-06-20 MED ORDER — LEVOTHYROXINE SODIUM 100 MCG PO TABS
100.0000 ug | ORAL_TABLET | Freq: Every day | ORAL | 3 refills | Status: DC
  Filled 2022-06-20: qty 90, 90d supply, fill #0
  Filled 2022-10-03: qty 90, 90d supply, fill #1
  Filled 2023-01-09: qty 90, 90d supply, fill #2
  Filled 2023-04-13: qty 90, 90d supply, fill #3

## 2022-10-03 ENCOUNTER — Other Ambulatory Visit (HOSPITAL_COMMUNITY): Payer: Self-pay

## 2022-10-10 ENCOUNTER — Other Ambulatory Visit (HOSPITAL_COMMUNITY): Payer: Self-pay

## 2022-10-10 ENCOUNTER — Other Ambulatory Visit: Payer: Self-pay

## 2022-11-18 ENCOUNTER — Other Ambulatory Visit (HOSPITAL_COMMUNITY): Payer: Self-pay

## 2023-01-09 ENCOUNTER — Other Ambulatory Visit (HOSPITAL_COMMUNITY): Payer: Self-pay

## 2023-02-10 ENCOUNTER — Encounter: Payer: Self-pay | Admitting: *Deleted

## 2023-04-12 ENCOUNTER — Other Ambulatory Visit: Payer: Self-pay

## 2023-04-13 ENCOUNTER — Other Ambulatory Visit: Payer: Self-pay

## 2023-05-01 ENCOUNTER — Telehealth: Payer: 59 | Admitting: Physician Assistant

## 2023-05-01 DIAGNOSIS — B3731 Acute candidiasis of vulva and vagina: Secondary | ICD-10-CM

## 2023-05-01 MED ORDER — FLUCONAZOLE 150 MG PO TABS: 150.0000 mg | ORAL_TABLET | ORAL | 0 refills | Status: DC | PRN

## 2023-05-01 NOTE — Progress Notes (Signed)

## 2023-05-02 ENCOUNTER — Telehealth: Payer: 59 | Admitting: Physician Assistant

## 2023-05-02 DIAGNOSIS — B9689 Other specified bacterial agents as the cause of diseases classified elsewhere: Secondary | ICD-10-CM

## 2023-05-03 MED ORDER — METRONIDAZOLE 500 MG PO TABS: 500.0000 mg | ORAL_TABLET | Freq: Two times a day (BID) | ORAL | 0 refills | Status: AC

## 2023-05-03 NOTE — Progress Notes (Signed)
E-Visit for Vaginal Symptoms  We are sorry that you are not feeling well. Here is how we plan to help! Based on what you shared with me it looks like you: May have a vaginosis due to bacteria  Vaginosis is an inflammation of the vagina that can result in discharge, itching and pain. The cause is usually a change in the normal balance of vaginal bacteria or an infection. Vaginosis can also result from reduced estrogen levels after menopause.  The most common causes of vaginosis are:   Bacterial vaginosis which results from an overgrowth of one on several organisms that are normally present in your vagina.   Yeast infections which are caused by a naturally occurring fungus called candida.   Vaginal atrophy (atrophic vaginosis) which results from the thinning of the vagina from reduced estrogen levels after menopause.   Trichomoniasis which is caused by a parasite and is commonly transmitted by sexual intercourse.  Factors that increase your risk of developing vaginosis include: Medications, such as antibiotics and steroids Uncontrolled diabetes Use of hygiene products such as bubble bath, vaginal spray or vaginal deodorant Douching Wearing damp or tight-fitting clothing Using an intrauterine device (IUD) for birth control Hormonal changes, such as those associated with pregnancy, birth control pills or menopause Sexual activity Having a sexually transmitted infection  Your treatment plan is Metronidazole or Flagyl 500mg twice a day for 7 days.  I have electronically sent this prescription into the pharmacy that you have chosen.  Be sure to take all of the medication as directed. Stop taking any medication if you develop a rash, tongue swelling or shortness of breath. Mothers who are breast feeding should consider pumping and discarding their breast milk while on these antibiotics. However, there is no consensus that infant exposure at these doses would be harmful.  Remember that  medication creams can weaken latex condoms. .   HOME CARE:  Good hygiene may prevent some types of vaginosis from recurring and may relieve some symptoms:  Avoid baths, hot tubs and whirlpool spas. Rinse soap from your outer genital area after a shower, and dry the area well to prevent irritation. Don't use scented or harsh soaps, such as those with deodorant or antibacterial action. Avoid irritants. These include scented tampons and pads. Wipe from front to back after using the toilet. Doing so avoids spreading fecal bacteria to your vagina.  Other things that may help prevent vaginosis include:  Don't douche. Your vagina doesn't require cleansing other than normal bathing. Repetitive douching disrupts the normal organisms that reside in the vagina and can actually increase your risk of vaginal infection. Douching won't clear up a vaginal infection. Use a latex condom. Both female and female latex condoms may help you avoid infections spread by sexual contact. Wear cotton underwear. Also wear pantyhose with a cotton crotch. If you feel comfortable without it, skip wearing underwear to bed. Yeast thrives in moist environments Your symptoms should improve in the next day or two.  GET HELP RIGHT AWAY IF:  You have pain in your lower abdomen ( pelvic area or over your ovaries) You develop nausea or vomiting You develop a fever Your discharge changes or worsens You have persistent pain with intercourse You develop shortness of breath, a rapid pulse, or you faint.  These symptoms could be signs of problems or infections that need to be evaluated by a medical provider now.  MAKE SURE YOU   Understand these instructions. Will watch your condition. Will get help right   away if you are not doing well or get worse.  Thank you for choosing an e-visit.  Your e-visit answers were reviewed by a board certified advanced clinical practitioner to complete your personal care plan. Depending upon the  condition, your plan could have included both over the counter or prescription medications.  Please review your pharmacy choice. Make sure the pharmacy is open so you can pick up prescription now. If there is a problem, you may contact your provider through MyChart messaging and have the prescription routed to another pharmacy.  Your safety is important to us. If you have drug allergies check your prescription carefully.   For the next 24 hours you can use MyChart to ask questions about today's visit, request a non-urgent call back, or ask for a work or school excuse. You will get an email in the next two days asking about your experience. I hope that your e-visit has been valuable and will speed your recovery.  I have spent 5 minutes in review of e-visit questionnaire, review and updating patient chart, medical decision making and response to patient.   Jennifer M Burnette, PA-C  

## 2023-05-03 NOTE — Addendum Note (Signed)
Addended by: Margaretann Loveless on: 05/03/2023 08:24 AM   Modules accepted: Level of Service

## 2023-05-15 ENCOUNTER — Other Ambulatory Visit: Payer: Self-pay | Admitting: Adult Health

## 2023-05-15 DIAGNOSIS — E039 Hypothyroidism, unspecified: Secondary | ICD-10-CM

## 2023-06-07 DIAGNOSIS — E039 Hypothyroidism, unspecified: Secondary | ICD-10-CM | POA: Diagnosis not present

## 2023-06-08 LAB — TSH+FREE T4
Free T4: 1.52 ng/dL (ref 0.82–1.77)
TSH: 0.878 u[IU]/mL (ref 0.450–4.500)

## 2023-07-05 ENCOUNTER — Other Ambulatory Visit: Payer: Self-pay

## 2023-07-05 ENCOUNTER — Other Ambulatory Visit: Payer: Self-pay | Admitting: Adult Health

## 2023-07-05 MED ORDER — LEVOTHYROXINE SODIUM 100 MCG PO TABS
100.0000 ug | ORAL_TABLET | Freq: Every day | ORAL | 3 refills | Status: DC
  Filled 2023-07-05: qty 90, 90d supply, fill #0
  Filled 2023-07-20: qty 90, 90d supply, fill #1
  Filled 2023-11-02 – 2024-01-01 (×2): qty 90, 90d supply, fill #2
  Filled 2024-03-25: qty 90, 90d supply, fill #3

## 2023-07-10 ENCOUNTER — Telehealth: Payer: 59 | Admitting: Family Medicine

## 2023-07-10 DIAGNOSIS — R3989 Other symptoms and signs involving the genitourinary system: Secondary | ICD-10-CM

## 2023-07-10 MED ORDER — NITROFURANTOIN MONOHYD MACRO 100 MG PO CAPS: 100.0000 mg | ORAL_CAPSULE | Freq: Two times a day (BID) | ORAL | 0 refills | Status: AC

## 2023-07-10 NOTE — Progress Notes (Signed)

## 2023-07-20 ENCOUNTER — Other Ambulatory Visit (HOSPITAL_COMMUNITY): Payer: Self-pay

## 2023-07-20 ENCOUNTER — Other Ambulatory Visit: Payer: Self-pay

## 2023-09-04 ENCOUNTER — Ambulatory Visit: Payer: 59 | Admitting: Adult Health

## 2023-09-04 ENCOUNTER — Encounter: Payer: Self-pay | Admitting: Adult Health

## 2023-09-04 VITALS — BP 118/76 | HR 74 | Ht 62.0 in | Wt 133.0 lb

## 2023-09-04 DIAGNOSIS — R4589 Other symptoms and signs involving emotional state: Secondary | ICD-10-CM | POA: Diagnosis not present

## 2023-09-04 DIAGNOSIS — G43109 Migraine with aura, not intractable, without status migrainosus: Secondary | ICD-10-CM | POA: Diagnosis not present

## 2023-09-04 DIAGNOSIS — F32A Depression, unspecified: Secondary | ICD-10-CM | POA: Diagnosis not present

## 2023-09-04 DIAGNOSIS — R232 Flushing: Secondary | ICD-10-CM

## 2023-09-04 DIAGNOSIS — Z1331 Encounter for screening for depression: Secondary | ICD-10-CM | POA: Diagnosis not present

## 2023-09-04 DIAGNOSIS — F419 Anxiety disorder, unspecified: Secondary | ICD-10-CM | POA: Insufficient documentation

## 2023-09-04 DIAGNOSIS — Z90710 Acquired absence of both cervix and uterus: Secondary | ICD-10-CM

## 2023-09-04 DIAGNOSIS — G479 Sleep disorder, unspecified: Secondary | ICD-10-CM | POA: Diagnosis not present

## 2023-09-04 DIAGNOSIS — N951 Menopausal and female climacteric states: Secondary | ICD-10-CM | POA: Insufficient documentation

## 2023-09-04 DIAGNOSIS — R61 Generalized hyperhidrosis: Secondary | ICD-10-CM | POA: Insufficient documentation

## 2023-09-04 MED ORDER — HYDROXYZINE HCL 10 MG PO TABS: 10.0000 mg | ORAL_TABLET | Freq: Three times a day (TID) | ORAL | 2 refills | Status: DC | PRN

## 2023-09-04 MED ORDER — ESCITALOPRAM OXALATE 10 MG PO TABS: 10.0000 mg | ORAL_TABLET | Freq: Every day | ORAL | 2 refills | Status: DC

## 2023-09-04 NOTE — Progress Notes (Signed)
Subjective:     Patient ID: Nicole Bell, female   DOB: 1981-07-23, 42 y.o.   MRN: [id number]  HPI Nicole Bell is a 42 year old white female, separated, sp hysterectomy, in complaining of being moody and emotional, depressed and teary. Not sleeping well, tired, has hot flashes and night sweats, brain fog and heart palpitations and more headaches. Has migraines with auras.  All this has been going on for few months but worse last few week, broke up with boyfriend. Works at [employer] in [place].  Review of Systems + moody and emotional, depressed and teary. Not sleeping well, tired, has hot flashes and night sweats, brain fog and heart palpitations and more headaches. Has migraines with auras.  All this has been going on for few months but worse last few week, broke up with boyfriend.   Reviewed past medical,surgical, social and family history. Reviewed medications and allergies.  Objective:   Physical Exam BP 118/76 (BP Location: Right Arm, Patient Position: Sitting, Cuff Size: Normal)   Pulse 74   Ht 5\' 2"  (1.575 m)   Wt 133 lb (60.3 kg)   BMI 24.33 kg/m     Skin warm and dry.  Lungs: clear to ausculation bilaterally. Cardiovascular: regular rate and rhythm.    09/04/2023    3:30 PM 12/14/2021    3:16 PM 11/26/2020    9:21 AM  Depression screen PHQ 2/9  Decreased Interest 1 0   Down, Depressed, Hopeless 1 0   PHQ - 2 Score 2 0   Altered sleeping 1 2   Tired, decreased energy 1 1   Change in appetite 1 0   Feeling bad or failure about yourself  0 0   Trouble concentrating 2 0   Moving slowly or fidgety/restless 0 0   Suicidal thoughts 0 0   PHQ-9 Score 7 3   Difficult doing work/chores  Not difficult at all      Information is confidential and restricted. Go to Review Flowsheets to unlock data.        09/04/2023    3:32 PM 12/14/2021    3:16 PM 11/26/2020    9:30 AM 11/04/2020   10:34 AM  GAD 7 : Generalized Anxiety Score  Nervous, Anxious, on Edge 3 2  1   Control/stop worrying 3 1  3    Worry too much - different things 3 2  1   Trouble relaxing 1 1  1   Restless 0 0  0  Easily annoyed or irritable 3 1  1   Afraid - awful might happen 0 0  0  Total GAD 7 Score 13 7  7   Anxiety Difficulty  Somewhat difficult  Somewhat difficult     Information is confidential and restricted. Go to Review Flowsheets to unlock data.      Upstream - 09/04/23 1529       Pregnancy Intention Screening   Does the patient want to become pregnant in the next year? N/A    Does the patient's partner want to become pregnant in the next year? N/A    Would the patient like to discuss contraceptive options today? N/A      Contraception Wrap Up   Current Method Female Sterilization   hysterectomy   End Method Female Sterilization   hysterectomy   Contraception Counseling Provided No             Assessment:     1. Moody  2. Anxiety and depression (Primary) +anxiety and depression, worse lately Will  rx lexapro 10 mg 1 daily and vistaril for prn use Meds ordered this encounter  Medications   escitalopram (LEXAPRO) 10 MG tablet    Sig: Take 1 tablet (10 mg total) by mouth daily.    Dispense:  30 tablet    Refill:  2    Supervising Provider:   Duane Lope H [[id number]]   hydrOXYzine (ATARAX) 10 MG tablet    Sig: Take 1 tablet (10 mg total) by mouth 3 (three) times daily as needed.    Dispense:  30 tablet    Refill:  2    Supervising Provider:   Duane Lope H [[id number]]     3. Hot flashes The lexapro may help with hot flashes too.  4. Night sweats  5. Sleep disturbance  6. S/P vaginal hysterectomy  7. Migraine with aura and without status migrainosus, not intractable Estrogen would not be good choice, may try a progestin later  8. Perimenopause Review handout on peri menopause and menopause     Plan:     Follow up in 4 weeks for ROS

## 2023-09-08 ENCOUNTER — Ambulatory Visit: Payer: 59 | Admitting: Adult Health

## 2023-09-17 ENCOUNTER — Telehealth: Payer: 59

## 2023-09-17 DIAGNOSIS — R3989 Other symptoms and signs involving the genitourinary system: Secondary | ICD-10-CM

## 2023-09-18 MED ORDER — NITROFURANTOIN MONOHYD MACRO 100 MG PO CAPS: 100.0000 mg | ORAL_CAPSULE | Freq: Two times a day (BID) | ORAL | 0 refills | Status: DC

## 2023-09-18 NOTE — Progress Notes (Signed)

## 2023-10-05 ENCOUNTER — Other Ambulatory Visit (HOSPITAL_COMMUNITY): Payer: Self-pay

## 2023-10-05 ENCOUNTER — Encounter: Payer: Self-pay | Admitting: Adult Health

## 2023-10-05 ENCOUNTER — Ambulatory Visit: Payer: Commercial Managed Care - PPO | Admitting: Adult Health

## 2023-10-05 VITALS — BP 130/77 | HR 58 | Ht 62.0 in | Wt 133.0 lb

## 2023-10-05 DIAGNOSIS — Z1331 Encounter for screening for depression: Secondary | ICD-10-CM | POA: Diagnosis not present

## 2023-10-05 DIAGNOSIS — F32A Depression, unspecified: Secondary | ICD-10-CM

## 2023-10-05 DIAGNOSIS — R4589 Other symptoms and signs involving emotional state: Secondary | ICD-10-CM | POA: Diagnosis not present

## 2023-10-05 DIAGNOSIS — F419 Anxiety disorder, unspecified: Secondary | ICD-10-CM | POA: Diagnosis not present

## 2023-10-05 DIAGNOSIS — R232 Flushing: Secondary | ICD-10-CM

## 2023-10-05 MED ORDER — ESCITALOPRAM OXALATE 10 MG PO TABS
10.0000 mg | ORAL_TABLET | Freq: Every day | ORAL | 2 refills | Status: DC
  Filled 2023-10-05: qty 30, 30d supply, fill #0
  Filled 2023-11-02 (×2): qty 30, 30d supply, fill #1

## 2023-10-05 MED ORDER — ESCITALOPRAM OXALATE 10 MG PO TABS: 10.0000 mg | ORAL_TABLET | Freq: Every day | ORAL | 6 refills | Status: DC

## 2023-10-05 NOTE — Progress Notes (Signed)
Subjective:     Patient ID: Nicole Bell, female   DOB: 1980/11/30, 43 y.o.   MRN: [id number]  HPI Nicole Bell is a 43 year old white female, separated, sp hysterectomy, back in follow up on starting lexapro and feels much better.   Review of Systems Feels much better on lexapro Still has some hot flashes Moods better Reviewed past medical,surgical, social and family history. Reviewed medications and allergies.     Objective:   Physical Exam BP 130/77 (BP Location: Left Arm, Patient Position: Sitting, Cuff Size: Normal)   Pulse (!) 58   Ht 5\' 2"  (1.575 m)   Wt 133 lb (60.3 kg)   BMI 24.33 kg/m     Skin warm and dry.  Lungs: clear to ausculation bilaterally. Cardiovascular: regular rate and rhythm.  Fall risk is low    10/05/2023    8:56 AM 09/04/2023    3:30 PM 12/14/2021    3:16 PM  Depression screen PHQ 2/9  Decreased Interest 0 1 0  Down, Depressed, Hopeless 0 1 0  PHQ - 2 Score 0 2 0  Altered sleeping 2 1 2   Tired, decreased energy 0 1 1  Change in appetite 1 1 0  Feeling bad or failure about yourself  0 0 0  Trouble concentrating 0 2 0  Moving slowly or fidgety/restless 0 0 0  Suicidal thoughts 0 0 0  PHQ-9 Score 3 7 3   Difficult doing work/chores   Not difficult at all       10/05/2023    8:57 AM 09/04/2023    3:32 PM 12/14/2021    3:16 PM 11/26/2020    9:30 AM  GAD 7 : Generalized Anxiety Score  Nervous, Anxious, on Edge 1 3 2    Control/stop worrying 1 3 1    Worry too much - different things 0 3 2   Trouble relaxing 0 1 1   Restless 0 0 0   Easily annoyed or irritable 0 3 1   Afraid - awful might happen 0 0 0   Total GAD 7 Score 2 13 7    Anxiety Difficulty   Somewhat difficult      Information is confidential and restricted. Go to Review Flowsheets to unlock data.    Upstream - 10/05/23 0901       Pregnancy Intention Screening   Does the patient want to become pregnant in the next year? N/A    Does the patient's partner want to become pregnant in the next  year? N/A    Would the patient like to discuss contraceptive options today? N/A      Contraception Wrap Up   Current Method Female Sterilization   hyst   End Method Female Sterilization   hyst   Contraception Counseling Provided No               Assessment:     1. Anxiety and depression Feels much better, will continue lexapro 10 mg 1 daily  Meds ordered this encounter  Medications   escitalopram (LEXAPRO) 10 MG tablet    Sig: Take 1 tablet (10 mg total) by mouth daily.    Dispense:  30 tablet    Refill:  6    Supervising Provider:   Duane Lope H [[id number]]     2. Moody Much better, feels better   3. Hot flashes Better but still having some     Plan:     Follow up in 6 months or sooner if needed

## 2023-10-30 ENCOUNTER — Telehealth: Payer: Commercial Managed Care - PPO

## 2023-10-30 DIAGNOSIS — R6889 Other general symptoms and signs: Secondary | ICD-10-CM | POA: Diagnosis not present

## 2023-10-31 NOTE — Progress Notes (Signed)
E visit for Flu like symptoms   We are sorry that you are not feeling well.  Here is how we plan to help! Based on what you have shared with me it looks like you may have flu-like symptoms that should be watched but do not seem to indicate anti-viral treatment.  Influenza or "the flu" is   an infection caused by a respiratory virus. The flu virus is highly contagious and persons who did not receive their yearly flu vaccination may "catch" the flu from close contact.   Based upon your symptoms and potential risk factors I recommend that you follow the flu symptoms recommendation that I have listed below.  Please keep well-hydrated and try to get plenty of rest. If you have a humidifier, place it in the bedroom and run it at night. Start a saline nasal rinse for nasal congestion. You can consider use of a nasal steroid spray like Flonase or Nasacort OTC. You can alternate between Tylenol and Ibuprofen if needed for fever, body aches, headache and/or throat pain. Salt water-gargles and chloraseptic spray can be very beneficial for sore throat. Mucinex-DM for congestion or cough. Please take all prescribed medications as directed.  Remain out of work until [employer] for 24 hours without a fever-reducing medication, and you are feeling better.  You should mask until symptoms are resolved.  If anything worsens despite treatment, you need to be evaluated in-person. Please do not delay care.   ANYONE WHO HAS FLU SYMPTOMS SHOULD: Stay home. The flu is highly contagious and going out or to work exposes others! Be sure to drink plenty of fluids. Water is fine as well as fruit juices, sodas and electrolyte beverages. You may want to stay away from caffeine or alcohol. If you are nauseated, try taking small sips of liquids. How do you know if you are getting enough fluid? Your urine should be a pale yellow or almost colorless. Get rest. Taking a steamy shower or using a humidifier may help nasal  congestion and ease sore throat pain. Using a saline nasal spray works much the same way. Cough drops, hard candies and sore throat lozenges may ease your cough. Line up a caregiver. Have someone check on you regularly.   GET HELP RIGHT AWAY IF: You cannot keep down liquids or your medications. You become short of breath Your fell like you are going to pass out or loose consciousness. Your symptoms persist after you have completed your treatment plan MAKE SURE YOU  Understand these instructions. Will watch your condition. Will get help right away if you are not doing well or get worse.  Your e-visit answers were reviewed by a board certified advanced clinical practitioner to complete your personal care plan.  Depending on the condition, your plan could have included both over the counter or prescription medications.  If there is a problem please reply  once you have received a response from your provider.  Your safety is important to [place].  If you have drug allergies check your prescription carefully.    You can use MyChart to ask questions about today's visit, request a non-urgent call back, or ask for a work or school excuse for 24 hours related to this e-Visit. If it has been greater than 24 hours you will need to follow up with your provider, or enter a new e-Visit to address those concerns.  You will get an e-mail in the next two days asking about your experience.  I hope that your e-visit  has been valuable and will speed your recovery. Thank you for using e-visits.

## 2023-10-31 NOTE — Progress Notes (Signed)
I have spent 5 minutes in review of e-visit questionnaire, review and updating patient chart, medical decision making and response to patient.   Piedad Climes, PA-C

## 2023-11-02 ENCOUNTER — Other Ambulatory Visit: Payer: Self-pay

## 2023-11-02 ENCOUNTER — Other Ambulatory Visit (HOSPITAL_COMMUNITY): Payer: Self-pay

## 2023-11-24 ENCOUNTER — Telehealth: Admitting: Nurse Practitioner

## 2023-11-24 DIAGNOSIS — R399 Unspecified symptoms and signs involving the genitourinary system: Secondary | ICD-10-CM | POA: Diagnosis not present

## 2023-11-25 MED ORDER — NITROFURANTOIN MONOHYD MACRO 100 MG PO CAPS: 100.0000 mg | ORAL_CAPSULE | Freq: Two times a day (BID) | ORAL | 0 refills | Status: AC

## 2023-11-25 NOTE — Progress Notes (Signed)
 E-Visit for Urinary Problems  Hello Ms. Nicole Bell,  Because we have treated you for uti at least 3 times in the last 5 months I would recommend if your symptoms reoccur that you have a vaginal swab test and urine test performed in your PCP office. Reoccuring UTIs should be evaluated in office by PCP or urologist. Keep in mind however I have sent your antibiotic for today.   We are sorry that you are not feeling well.  Here is how we plan to help!  Based on what you shared with me it looks like you most likely have a simple urinary tract infection.  A UTI (Urinary Tract Infection) is a bacterial infection of the bladder.  Most cases of urinary tract infections are simple to treat but a key part of your care is to encourage you to drink plenty of fluids and watch your symptoms carefully.  I have prescribed MacroBid 100 mg twice a day for 5 days.  Your symptoms should gradually improve. Call us if the burning in your urine worsens, you develop worsening fever, back pain or pelvic pain or if your symptoms do not resolve after completing the antibiotic.  Urinary tract infections can be prevented by drinking plenty of water to keep your body hydrated.  Also be sure when you wipe, wipe from front to back and don't hold it in!  If possible, empty your bladder every 4 hours.  HOME CARE Drink plenty of fluids Compete the full course of the antibiotics even if the symptoms resolve Remember, when you need to go.go. Holding in your urine can increase the likelihood of getting a UTI! GET HELP RIGHT AWAY IF: You cannot urinate You get a high fever Worsening back pain occurs You see blood in your urine You feel sick to your stomach or throw up You feel like you are going to pass out  MAKE SURE YOU  Understand these instructions. Will watch your condition. Will get help right away if you are not doing well or get worse.   Thank you for choosing an e-visit.  Your e-visit answers were reviewed by a  board certified advanced clinical practitioner to complete your personal care plan. Depending upon the condition, your plan could have included both over the counter or prescription medications.  Please review your pharmacy choice. Make sure the pharmacy is open so you can pick up prescription now. If there is a problem, you may contact your provider through [employer] and have the prescription routed to another pharmacy.  Your safety is important to [place]. If you have drug allergies check your prescription carefully.   For the next 24 hours you can use MyChart to ask questions about today's visit, request a non-urgent call back, or ask for a work or school excuse. You will get an email in the next two days asking about your experience. I hope that your e-visit has been valuable and will speed your recovery.

## 2023-11-25 NOTE — Progress Notes (Signed)
 I have spent 5 minutes in review of e-visit questionnaire, review and updating patient chart, medical decision making and response to patient.   Claiborne Rigg, NP

## 2023-11-27 ENCOUNTER — Other Ambulatory Visit (HOSPITAL_COMMUNITY): Payer: Self-pay

## 2023-11-27 ENCOUNTER — Other Ambulatory Visit: Payer: Self-pay | Admitting: Adult Health

## 2023-11-27 MED ORDER — ESCITALOPRAM OXALATE 10 MG PO TABS
10.0000 mg | ORAL_TABLET | Freq: Every day | ORAL | 6 refills | Status: DC
  Filled 2023-11-27 – 2023-11-28 (×2): qty 30, 30d supply, fill #0
  Filled 2024-01-01: qty 90, 90d supply, fill #1
  Filled 2024-01-05: qty 30, 30d supply, fill #1
  Filled 2024-02-05: qty 30, 30d supply, fill #2
  Filled 2024-03-02: qty 30, 30d supply, fill #3
  Filled 2024-04-12: qty 30, 30d supply, fill #4
  Filled 2024-05-07: qty 30, 30d supply, fill #5
  Filled 2024-06-18: qty 30, 30d supply, fill #6

## 2023-11-28 ENCOUNTER — Other Ambulatory Visit (HOSPITAL_COMMUNITY): Payer: Self-pay

## 2023-11-28 ENCOUNTER — Other Ambulatory Visit: Payer: Self-pay

## 2024-01-01 ENCOUNTER — Other Ambulatory Visit (HOSPITAL_COMMUNITY): Payer: Self-pay

## 2024-01-05 ENCOUNTER — Other Ambulatory Visit (HOSPITAL_COMMUNITY): Payer: Self-pay

## 2024-01-05 ENCOUNTER — Other Ambulatory Visit: Payer: Self-pay

## 2024-02-05 ENCOUNTER — Other Ambulatory Visit (HOSPITAL_COMMUNITY): Payer: Self-pay

## 2024-02-26 DIAGNOSIS — H52223 Regular astigmatism, bilateral: Secondary | ICD-10-CM | POA: Diagnosis not present

## 2024-03-02 ENCOUNTER — Other Ambulatory Visit: Payer: Self-pay

## 2024-03-06 ENCOUNTER — Other Ambulatory Visit (HOSPITAL_COMMUNITY): Payer: Self-pay | Admitting: Family Medicine

## 2024-03-06 DIAGNOSIS — Z1231 Encounter for screening mammogram for malignant neoplasm of breast: Secondary | ICD-10-CM

## 2024-03-13 ENCOUNTER — Encounter (HOSPITAL_COMMUNITY): Payer: Self-pay

## 2024-03-13 ENCOUNTER — Ambulatory Visit (HOSPITAL_COMMUNITY)
Admission: RE | Admit: 2024-03-13 | Discharge: 2024-03-13 | Disposition: A | Discharge status: Home / Self Care | Payer: Self-pay | Source: Ambulatory Visit | Attending: Family Medicine | Admitting: Family Medicine

## 2024-03-13 DIAGNOSIS — Z1231 Encounter for screening mammogram for malignant neoplasm of breast: Secondary | ICD-10-CM | POA: Insufficient documentation

## 2024-03-25 ENCOUNTER — Other Ambulatory Visit: Payer: Self-pay

## 2024-04-12 ENCOUNTER — Other Ambulatory Visit (HOSPITAL_COMMUNITY): Payer: Self-pay

## 2024-05-07 ENCOUNTER — Other Ambulatory Visit (HOSPITAL_COMMUNITY): Payer: Self-pay

## 2024-05-09 ENCOUNTER — Other Ambulatory Visit (HOSPITAL_COMMUNITY): Payer: Self-pay

## 2024-05-14 ENCOUNTER — Encounter: Payer: Self-pay | Admitting: Family Medicine

## 2024-05-14 ENCOUNTER — Encounter: Admitting: Family Medicine

## 2024-05-14 ENCOUNTER — Ambulatory Visit (INDEPENDENT_AMBULATORY_CARE_PROVIDER_SITE_OTHER): Admitting: Family Medicine

## 2024-05-14 VITALS — BP 99/64 | HR 63 | Temp 98.3°F | Ht 62.0 in | Wt 140.0 lb

## 2024-05-14 DIAGNOSIS — Z130 Encounter for screening for diseases of the blood and blood-forming organs and certain disorders involving the immune mechanism: Secondary | ICD-10-CM | POA: Diagnosis not present

## 2024-05-14 DIAGNOSIS — Z0000 Encounter for general adult medical examination without abnormal findings: Secondary | ICD-10-CM | POA: Insufficient documentation

## 2024-05-14 DIAGNOSIS — E039 Hypothyroidism, unspecified: Secondary | ICD-10-CM

## 2024-05-14 DIAGNOSIS — Z13228 Encounter for screening for other metabolic disorders: Secondary | ICD-10-CM | POA: Diagnosis not present

## 2024-05-14 DIAGNOSIS — E785 Hyperlipidemia, unspecified: Secondary | ICD-10-CM | POA: Insufficient documentation

## 2024-05-14 NOTE — Patient Instructions (Signed)
 Labs at your convenience.  Follow up annually.

## 2024-05-14 NOTE — Assessment & Plan Note (Signed)
 Doing well. Labs today. Discuss preventative health.

## 2024-05-14 NOTE — Progress Notes (Signed)
 Subjective:  Patient ID: Nicole Bell, female    DOB: 12/29/1980  Age: 43 y.o. MRN: [id number]  CC:   Chief Complaint  Patient presents with  . Annual Exam    HPI:  43 year old female presents for an annual exam.  Overall doing well. Reports hot flashes. Was recently started on Lexapro  and seems to be doing fairly well.  Needs labs. No indication for pneumococcal vaccine at this time. Needs annual flu vaccine.     Patient Active Problem List   Diagnosis Date Noted  . HLD (hyperlipidemia) 05/14/2024  . Annual physical exam 05/14/2024  . Anxiety and depression 09/04/2023  . Hot flashes 09/04/2023  . S/P vaginal hysterectomy 06/13/2018  . Hypothyroidism 04/16/2018  . IBS (irritable bowel syndrome) 08/05/2013    Social Hx   Social History   Socioeconomic History  . Marital status: Legally Separated    Spouse name: Not on file  . Number of children: Not on file  . Years of education: Not on file  . Highest education level: Not on file  Occupational History  . Not on file  Tobacco Use  . Smoking status: Former    Current packs/day: 0.00    Average packs/day: 0.5 packs/day for 15.0 years (7.5 ttl pk-yrs)    Types: Cigarettes    Start date: 11/08/2002    Quit date: 11/08/2017    Years since quitting: 6.5  . Smokeless tobacco: Never  Vaping Use  . Vaping status: Never Used  Substance and Sexual Activity  . Alcohol use: No  . Drug use: No  . Sexual activity: Yes    Birth control/protection: Surgical    Comment: hyst  Other Topics Concern  . Not on file  Social History Narrative  . Not on file   Social Drivers of Health   Financial Resource Strain: Low Risk  (09/04/2020)   Overall Financial Resource Strain (CARDIA)   . Difficulty of Paying Living Expenses: Not very hard  Food Insecurity: No Food Insecurity (09/04/2020)   Hunger Vital Sign   . Worried About Programme researcher, broadcasting/film/video in the Last Year: Never true   . Ran Out of Food in the Last Year: Never true   Transportation Needs: No Transportation Needs (09/04/2020)   PRAPARE - Transportation   . Lack of Transportation (Medical): No   . Lack of Transportation (Non-Medical): No  Physical Activity: Insufficiently Active (09/04/2020)   Exercise Vital Sign   . Days of Exercise per Week: 2 days   . Minutes of Exercise per Session: 20 min  Stress: Stress Concern Present (09/04/2020)   [employer] of Occupational Health - Occupational Stress Questionnaire   . Feeling of Stress : To some extent  Social Connections: Moderately Isolated (09/04/2020)   Social Connection and Isolation Panel   . Frequency of Communication with Friends and Family: More than three times a week   . Frequency of Social Gatherings with Friends and Family: Twice a week   . Attends Religious Services: 1 to 4 times per year   . Active Member of Clubs or Organizations: No   . Attends Banker Meetings: Never   . Marital Status: Divorced    Review of Systems Per HPI  Objective:  BP 99/64   Pulse 63   Temp 98.3 F (36.8 C)   Ht 5' 2 (1.575 m)   Wt 140 lb (63.5 kg)   SpO2 98%   BMI 25.61 kg/m      05/14/2024  2:54 PM 10/05/2023    8:55 AM 09/04/2023    3:33 PM  BP/Weight  Systolic BP 99 130 118  Diastolic BP 64 77 76  Wt. (Lbs) 140 133 133  BMI 25.61 kg/m2 24.33 kg/m2 24.33 kg/m2    Physical Exam Vitals and nursing note reviewed.  Constitutional:      General: She is not in acute distress.    Appearance: Normal appearance.  HENT:     Head: Normocephalic and atraumatic.  Eyes:     General:        Right eye: No discharge.        Left eye: No discharge.     Conjunctiva/sclera: Conjunctivae normal.  Cardiovascular:     Rate and Rhythm: Normal rate and regular rhythm.  Pulmonary:     Effort: Pulmonary effort is normal.     Breath sounds: Normal breath sounds. No wheezing, rhonchi or rales.  Neurological:     Mental Status: She is alert.  Psychiatric:        Mood and Affect: Mood  normal.        Behavior: Behavior normal.    Lab Results  Component Value Date   WBC 6.4 12/14/2021   HGB 14.1 12/14/2021   HCT 41.1 12/14/2021   PLT 208 12/14/2021   GLUCOSE 91 12/14/2021   CHOL 174 12/14/2021   TRIG 86 12/14/2021   HDL 56 12/14/2021   LDLCALC 102 (H) 12/14/2021   ALT 11 12/14/2021   AST 11 12/14/2021   NA 137 12/14/2021   K 3.6 12/14/2021   CL 101 12/14/2021   CREATININE 0.80 12/14/2021   BUN 9 12/14/2021   CO2 23 12/14/2021   TSH 0.878 06/07/2023   HGBA1C 5.3 12/14/2021     Assessment & Plan:  Annual physical exam Assessment & Plan: Doing well. Labs today. Discuss preventative health.    Hyperlipidemia, unspecified hyperlipidemia type -     Lipid panel  Hypothyroidism, unspecified type -     TSH  Screening for deficiency anemia -     CBC  Screening for metabolic disorder -     CMP14+EGFR    Follow-up:  Annually  Jacqulyn Ahle DO [facility]

## 2024-06-19 ENCOUNTER — Other Ambulatory Visit (HOSPITAL_COMMUNITY): Payer: Self-pay

## 2024-06-22 ENCOUNTER — Other Ambulatory Visit: Payer: Self-pay | Admitting: Adult Health

## 2024-06-23 ENCOUNTER — Other Ambulatory Visit (HOSPITAL_COMMUNITY): Payer: Self-pay

## 2024-06-23 MED ORDER — LEVOTHYROXINE SODIUM 100 MCG PO TABS
100.0000 ug | ORAL_TABLET | Freq: Every day | ORAL | 0 refills | Status: AC
  Filled 2024-06-23 – 2024-08-12 (×2): qty 90, 90d supply, fill #0

## 2024-06-24 ENCOUNTER — Other Ambulatory Visit: Payer: Self-pay

## 2024-07-12 ENCOUNTER — Other Ambulatory Visit: Payer: Self-pay | Admitting: Adult Health

## 2024-07-15 ENCOUNTER — Other Ambulatory Visit (HOSPITAL_COMMUNITY): Payer: Self-pay

## 2024-07-15 ENCOUNTER — Other Ambulatory Visit: Payer: Self-pay

## 2024-07-15 MED ORDER — ESCITALOPRAM OXALATE 10 MG PO TABS
10.0000 mg | ORAL_TABLET | Freq: Every day | ORAL | 1 refills | Status: AC
  Filled 2024-07-15: qty 90, 90d supply, fill #0

## 2024-08-12 ENCOUNTER — Other Ambulatory Visit: Payer: Self-pay

## 2024-08-12 ENCOUNTER — Other Ambulatory Visit (HOSPITAL_COMMUNITY): Payer: Self-pay
